# Patient Record
Sex: Male | Born: 1960
Health system: Southern US, Community
[De-identification: ages and names within clinical notes are randomized; demographics above are authoritative.]

## PROBLEM LIST (undated history)

## (undated) DIAGNOSIS — Z9289 Personal history of other medical treatment: Secondary | ICD-10-CM

## (undated) DIAGNOSIS — K219 Gastro-esophageal reflux disease without esophagitis: Secondary | ICD-10-CM

## (undated) DIAGNOSIS — T466X5A Adverse effect of antihyperlipidemic and antiarteriosclerotic drugs, initial encounter: Secondary | ICD-10-CM

## (undated) DIAGNOSIS — R011 Cardiac murmur, unspecified: Secondary | ICD-10-CM

## (undated) DIAGNOSIS — I2510 Atherosclerotic heart disease of native coronary artery without angina pectoris: Secondary | ICD-10-CM

## (undated) DIAGNOSIS — I219 Acute myocardial infarction, unspecified: Secondary | ICD-10-CM

## (undated) DIAGNOSIS — I10 Essential (primary) hypertension: Secondary | ICD-10-CM

## (undated) DIAGNOSIS — R9439 Abnormal result of other cardiovascular function study: Secondary | ICD-10-CM

## (undated) DIAGNOSIS — F419 Anxiety disorder, unspecified: Secondary | ICD-10-CM

## (undated) DIAGNOSIS — I519 Heart disease, unspecified: Secondary | ICD-10-CM

## (undated) DIAGNOSIS — R931 Abnormal findings on diagnostic imaging of heart and coronary circulation: Secondary | ICD-10-CM

## (undated) DIAGNOSIS — F1910 Other psychoactive substance abuse, uncomplicated: Secondary | ICD-10-CM

## (undated) DIAGNOSIS — E785 Hyperlipidemia, unspecified: Secondary | ICD-10-CM

## (undated) DIAGNOSIS — M6282 Rhabdomyolysis: Secondary | ICD-10-CM

## (undated) DIAGNOSIS — I739 Peripheral vascular disease, unspecified: Secondary | ICD-10-CM

## (undated) HISTORY — DX: Abnormal result of other cardiovascular function study: R94.39

## (undated) HISTORY — DX: Gastro-esophageal reflux disease without esophagitis: K21.9

## (undated) HISTORY — DX: Other psychoactive substance abuse, uncomplicated: F19.10

## (undated) HISTORY — DX: Hyperlipidemia, unspecified: E78.5

## (undated) HISTORY — DX: Personal history of other medical treatment: Z92.89

## (undated) HISTORY — DX: Abnormal findings on diagnostic imaging of heart and coronary circulation: R93.1

## (undated) HISTORY — DX: Heart disease, unspecified: I51.9

## (undated) HISTORY — DX: Anxiety disorder, unspecified: F41.9

## (undated) HISTORY — DX: Cardiac murmur, unspecified: R01.1

## (undated) HISTORY — DX: Peripheral vascular disease, unspecified: I73.9

## (undated) HISTORY — DX: Essential (primary) hypertension: I10

---

## 2009-09-18 DIAGNOSIS — I219 Acute myocardial infarction, unspecified: Secondary | ICD-10-CM

## 2009-09-18 HISTORY — DX: Acute myocardial infarction, unspecified: I21.9

## 2009-10-06 ENCOUNTER — Inpatient Hospital Stay (HOSPITAL_COMMUNITY): Admission: EM | Admit: 2009-10-06 | Discharge: 2009-10-10 | Payer: Self-pay | Admitting: Emergency Medicine

## 2009-10-06 HISTORY — PX: CORONARY STENT PLACEMENT: SHX1402

## 2009-11-18 DIAGNOSIS — R931 Abnormal findings on diagnostic imaging of heart and coronary circulation: Secondary | ICD-10-CM

## 2009-11-18 DIAGNOSIS — R9439 Abnormal result of other cardiovascular function study: Secondary | ICD-10-CM

## 2009-11-18 HISTORY — DX: Abnormal result of other cardiovascular function study: R94.39

## 2009-11-18 HISTORY — DX: Abnormal findings on diagnostic imaging of heart and coronary circulation: R93.1

## 2010-01-25 ENCOUNTER — Emergency Department (HOSPITAL_COMMUNITY): Admission: EM | Admit: 2010-01-25 | Discharge: 2010-01-26 | Payer: Self-pay | Admitting: Emergency Medicine

## 2010-07-16 ENCOUNTER — Emergency Department (HOSPITAL_COMMUNITY): Admission: EM | Admit: 2010-07-16 | Discharge: 2010-07-16 | Payer: Self-pay | Admitting: Emergency Medicine

## 2010-08-30 ENCOUNTER — Emergency Department (HOSPITAL_COMMUNITY): Admission: EM | Admit: 2010-08-30 | Discharge: 2010-08-30 | Payer: Self-pay | Admitting: Emergency Medicine

## 2010-10-25 ENCOUNTER — Emergency Department (HOSPITAL_COMMUNITY): Admission: EM | Admit: 2010-10-25 | Discharge: 2010-07-20 | Payer: Self-pay | Admitting: Emergency Medicine

## 2010-12-07 ENCOUNTER — Emergency Department (HOSPITAL_COMMUNITY)
Admission: EM | Admit: 2010-12-07 | Discharge: 2010-12-08 | Payer: Self-pay | Source: Home / Self Care | Admitting: Emergency Medicine

## 2010-12-10 ENCOUNTER — Observation Stay (HOSPITAL_COMMUNITY): Admission: EM | Admit: 2010-12-10 | Discharge: 2010-12-11 | Payer: Self-pay | Source: Home / Self Care

## 2010-12-11 LAB — DIFFERENTIAL
Basophils Absolute: 0 10*3/uL (ref 0.0–0.1)
Eosinophils Absolute: 0 10*3/uL (ref 0.0–0.7)
Lymphocytes Relative: 29 % (ref 12–46)
Lymphs Abs: 2.1 10*3/uL (ref 0.7–4.0)
Monocytes Relative: 8 % (ref 3–12)
Monocytes Relative: 10 % (ref 3–12)
Neutrophils Relative %: 66 % (ref 43–77)

## 2010-12-11 LAB — CBC
HCT: 38.7 % — ABNORMAL LOW (ref 39.0–52.0)
Hemoglobin: 14.2 g/dL (ref 13.0–17.0)
MCH: 31.6 pg (ref 26.0–34.0)
MCHC: 36.8 g/dL — ABNORMAL HIGH (ref 30.0–36.0)
MCV: 85.4 fL (ref 78.0–100.0)
MCV: 86 fL (ref 78.0–100.0)
RBC: 4.65 MIL/uL (ref 4.22–5.81)
RBC: 4.5 MIL/uL (ref 4.22–5.81)
WBC: 8.2 10*3/uL (ref 4.0–10.5)

## 2010-12-11 LAB — BASIC METABOLIC PANEL
BUN: 7 mg/dL (ref 6–23)
Calcium: 9.3 mg/dL (ref 8.4–10.5)
GFR calc Af Amer: >60 mL/min (ref >60)
GFR calc non Af Amer: >60 mL/min (ref >60)
Glucose, Bld: 91 mg/dL (ref 70–99)
Potassium: 3.9 mEq/L (ref 3.5–5.1)
Sodium: 135 mEq/L (ref 135–145)

## 2010-12-11 LAB — POCT CARDIAC MARKERS
Myoglobin, poc: 50.1 ng/mL (ref 12–200)
Troponin i, poc: <0.05 ng/mL (ref 0.00–0.09)

## 2010-12-12 LAB — BASIC METABOLIC PANEL
BUN: 5 mg/dL — ABNORMAL LOW (ref 6–23)
CO2: 27 mEq/L (ref 19–32)
Calcium: 8.8 mg/dL (ref 8.4–10.5)
Chloride: 105 mEq/L (ref 96–112)
Creatinine, Ser: 0.78 mg/dL (ref 0.4–1.5)
Glucose, Bld: 135 mg/dL — ABNORMAL HIGH (ref 70–99)

## 2010-12-12 LAB — DIFFERENTIAL
Basophils Relative: 0 % (ref 0–1)
Eosinophils Absolute: 0.1 10*3/uL (ref 0.0–0.7)
Eosinophils Relative: 1 % (ref 0–5)
Lymphs Abs: 2.4 10*3/uL (ref 0.7–4.0)
Monocytes Absolute: 0.6 10*3/uL (ref 0.1–1.0)
Monocytes Relative: 9 % (ref 3–12)

## 2010-12-12 LAB — COMPREHENSIVE METABOLIC PANEL
ALT: 16 U/L (ref 0–53)
AST: 19 U/L (ref 0–37)
Alkaline Phosphatase: 43 U/L (ref 39–117)
CO2: 28 mEq/L (ref 19–32)
Calcium: 9.1 mg/dL (ref 8.4–10.5)
GFR calc Af Amer: >60 mL/min (ref >60)
Potassium: 3.9 mEq/L (ref 3.5–5.1)

## 2010-12-12 LAB — CARDIAC PANEL(CRET KIN+CKTOT+MB+TROPI)
CK, MB: 1.6 ng/mL (ref 0.3–4.0)
Total CK: 50 U/L (ref 7–232)
Troponin I: <0.01 ng/mL (ref 0.00–0.06)

## 2010-12-12 LAB — CBC
Hemoglobin: 13.4 g/dL (ref 13.0–17.0)
MCH: 31.2 pg (ref 26.0–34.0)
MCHC: 36.2 g/dL — ABNORMAL HIGH (ref 30.0–36.0)
MCV: 86 fL (ref 78.0–100.0)
Platelets: 231 10*3/uL (ref 150–400)
RBC: 4.3 MIL/uL (ref 4.22–5.81)
RDW: 12.3 % (ref 11.5–15.5)

## 2010-12-12 LAB — POCT CARDIAC MARKERS: CKMB, poc: <1 ng/mL — ABNORMAL LOW (ref 1.0–8.0)

## 2010-12-13 ENCOUNTER — Inpatient Hospital Stay (HOSPITAL_COMMUNITY)
Admission: EM | Admit: 2010-12-13 | Discharge: 2010-12-14 | Payer: Self-pay | Source: Home / Self Care | Attending: Cardiovascular Disease | Admitting: Cardiovascular Disease

## 2010-12-13 LAB — DIFFERENTIAL
Basophils Absolute: 0 10*3/uL (ref 0.0–0.1)
Basophils Relative: 0 % (ref 0–1)
Eosinophils Absolute: 0 10*3/uL (ref 0.0–0.7)
Eosinophils Relative: 0 % (ref 0–5)
Lymphocytes Relative: 20 % (ref 12–46)
Monocytes Absolute: 0.5 10*3/uL (ref 0.1–1.0)
Monocytes Relative: 7 % (ref 3–12)
Neutro Abs: 5.5 10*3/uL (ref 1.7–7.7)
Neutrophils Relative %: 73 % (ref 43–77)

## 2010-12-13 LAB — POCT CARDIAC MARKERS: Troponin i, poc: <0.05 ng/mL (ref 0.00–0.09)

## 2010-12-13 LAB — BASIC METABOLIC PANEL
CO2: 26 mEq/L (ref 19–32)
Creatinine, Ser: 0.76 mg/dL (ref 0.4–1.5)
GFR calc Af Amer: >60 mL/min (ref >60)
GFR calc non Af Amer: >60 mL/min (ref >60)
Potassium: 4.5 mEq/L (ref 3.5–5.1)

## 2010-12-13 LAB — CBC
MCH: 30.2 pg (ref 26.0–34.0)
MCV: 86.5 fL (ref 78.0–100.0)
WBC: 7.5 10*3/uL (ref 4.0–10.5)

## 2010-12-13 LAB — PROTIME-INR: INR: 1.01 (ref 0.00–1.49)

## 2010-12-14 DIAGNOSIS — Z9289 Personal history of other medical treatment: Secondary | ICD-10-CM

## 2010-12-14 HISTORY — DX: Personal history of other medical treatment: Z92.89

## 2010-12-14 HISTORY — PX: CARDIAC CATHETERIZATION: SHX172

## 2010-12-14 LAB — CARDIAC PANEL(CRET KIN+CKTOT+MB+TROPI): Relative Index: INVALID (ref 0.0–2.5)

## 2010-12-14 LAB — COMPREHENSIVE METABOLIC PANEL
ALT: 16 U/L (ref 0–53)
AST: 20 U/L (ref 0–37)
Albumin: 4.5 g/dL (ref 3.5–5.2)
BUN: 7 mg/dL (ref 6–23)
Calcium: 9.5 mg/dL (ref 8.4–10.5)
GFR calc Af Amer: >60 mL/min (ref >60)
Glucose, Bld: 88 mg/dL (ref 70–99)
Sodium: 136 mEq/L (ref 135–145)
Total Bilirubin: 1.2 mg/dL (ref 0.3–1.2)
Total Protein: 6.7 g/dL (ref 6.0–8.3)

## 2010-12-14 LAB — CBC
HCT: 40.7 % (ref 39.0–52.0)
MCHC: 34.4 g/dL (ref 30.0–36.0)
MCV: 86.8 fL (ref 78.0–100.0)
Platelets: 243 10*3/uL (ref 150–400)
RDW: 12.2 % (ref 11.5–15.5)
WBC: 6.9 10*3/uL (ref 4.0–10.5)

## 2010-12-14 LAB — CK TOTAL AND CKMB (NOT AT ARMC)
CK, MB: 1.9 ng/mL (ref 0.3–4.0)
Total CK: 60 U/L (ref 7–232)

## 2010-12-14 LAB — LIPID PANEL
Cholesterol: 126 mg/dL (ref 0–200)
LDL Cholesterol: 57 mg/dL (ref 0–99)
VLDL: 5 mg/dL (ref 0–40)

## 2010-12-14 LAB — BASIC METABOLIC PANEL
BUN: 5 mg/dL — ABNORMAL LOW (ref 6–23)
CO2: 25 mEq/L (ref 19–32)
Calcium: 9 mg/dL (ref 8.4–10.5)
Chloride: 105 mEq/L (ref 96–112)
Creatinine, Ser: 0.7 mg/dL (ref 0.4–1.5)
GFR calc Af Amer: >60 mL/min (ref >60)
Glucose, Bld: 84 mg/dL (ref 70–99)

## 2010-12-14 LAB — MAGNESIUM: Magnesium: 1.7 mg/dL (ref 1.5–2.5)

## 2010-12-14 LAB — POCT CARDIAC MARKERS: Myoglobin, poc: 27 ng/mL (ref 12–200)

## 2010-12-18 NOTE — Discharge Summary (Signed)
Jonathan [name], Jonathan [name]          ACCOUNT NO.:  [id number]  MEDICAL RECORD NO.:  [id number]          PATIENT TYPE:  OBV  LOCATION:  IC11                          FACILITY:  [facility]  PHYSICIAN:  Wilson Singer, M.D.DATE OF BIRTH:  [birth year]  DATE OF ADMISSION:  12/10/2010 DATE OF DISCHARGE:  01/24/2012LH                              DISCHARGE SUMMARY   FINAL DISCHARGE DIAGNOSES: 1. Noncardiac chest and abdominal tightness. 2. Coronary artery disease status post inferior myocardial infarction,     November 2010, status post percutaneous intervention of the branch     of the circumflex marginal vessel with normal ejection fraction on     a stress test with no evidence of ischemia in September 2011. 3. Gastroesophageal reflux disease.  CONDITION ON DISCHARGE:  Stable.  MEDICATIONS ON DISCHARGE:  He is to continue his home medications which include; 1. Bystolic 5 mg daily. 2. Lisinopril 10 mg daily. 3. Plavix 75 mg daily. 4. Protonix 40 mg daily. 5. Aspirin 81 mg daily. 6. Simvastatin 40 mg nightly. 7. Niaspan 500 mg nightly. 8. Nitroglycerin as needed. 9. Multivitamin daily. 10.Zolpidem 2 mg daily. 11.Vitamin C 1000 mg b.i.d. 12.Ginger root 550 mg daily. 13.Fish oil nightly. 14.Isosorbide mononitrate 30 mg daily. 15.Super B-complex daily. 16.Vitamin E 400 international units nightly.  HISTORY:  This very pleasant 50 year old man was admitted to the hospital with a 2-week history of abdominal tightness radiating up to his chest and almost always relieved by opening his bowels.  The chest tightness was not really related to any exertion and it is very different from the pain and tightness he felt with his myocardial infarction in November 2010.  Please see initial history and physical examination by Dr. Osvaldo Shipper.  HOSPITAL PROGRESS:  The patient was admitted overnight and had serial cardiac enzymes, which are essentially negative for ischemia and  also electrocardiograms are unchanged and normal sinus rhythm with no evidence of ST-T-wave changes are new.  I feel that he is clinically stable to be discharged now and I have spoken with Dr. Julieanne Manson, Cardiology with [facility], who is with the group that is his cardiologist and he agrees that he can be discharged and they will schedule him for an outpatient appointment in the next week or so.  DISPOSITION:  Discharged home with outpatient followup with Cardiology, [facility].     Wilson Singer, M.D.     NCG/MEDQ  D:  12/11/2010  T:  12/12/2010  Job:  [id number]  Electronically Signed by Lilly Cove M.D. on 12/18/2010 02:09:23 PM

## 2010-12-18 NOTE — Procedures (Signed)
NAME:  Jonathan [name], Jonathan [name]          ACCOUNT NO.:  [id number]  MEDICAL RECORD NO.:  [id number]           PATIENT TYPE:  LOCATION:                                 FACILITY:  PHYSICIAN:  Thurmon Fair, MD     DATE OF BIRTH:  [birth year]  DATE OF PROCEDURE: DATE OF DISCHARGE:                           CARDIAC CATHETERIZATION   PREOPERATIVE DIAGNOSES: 1. Recurrent chest pain at rest. 2. Coronary disease status post previous inferolateral wall myocardial     infarction status post percutaneous revascularization and placement     of bare-metal stent in March 2011.  POSTOPERATIVE DIAGNOSES: 1. Minor coronary atherosclerotic disease with widely patent stent     with minimal in-stent restenosis. 2. Noncardiac chest pain, favor peptic ulcer disease as possible     etiology.  Jonathan [name] is a 50 year old man with a history of an acute myocardial infarction due to total occlusion of a large oblique marginal branch of the left circumflex coronary artery in March 2011.  This is a second admission this month with recurrent chest pain at rest raising concerns for possible recurrent coronary insufficiency.  There are no EKG changes or acute enzyme release.  PROCEDURES PERFORMED: 1. Left heart catheterization. 2. Selective coronary angiography. 3. Left ventriculography.  After the risks and benefits of the procedure were described, the patient provided informed consent and was brought to the cardiac cath lab in the fasting state.  The right radial artery area was prepped and draped in usual sterile fashion.  An Freida Busman test had previously been performed showing excellent patency of both the radial and ulnar supply to his palmar arch.  Using a micropuncture kit, access to the right radial artery was obtained and the guidewire was subsequently exchanged for a 5-French Glidesheath.  Under fluoroscopic guidance and using an exchange-length guidewire, a TIG4 5-French catheter was advanced  to the aortic root and was used to selectively cannulate and perform angiograms of both the left and right coronary arteries.  This was subsequently exchanged over the wire for a pigtail catheter which was used across the aortic valve and perform a left ventriculogram in the RAO projection.  At the end of the procedure, all catheters and sheath were removed and hemostasis was achieved with local pressure using a TR band with 13 mL of air.  During the procedure, a vasodilator cocktail was administered via the radial sheath consisting of verapamil 5 mg, nitroglycerin 400 mcg, and lidocaine 1% 2 mL.  No complications occurred.  FINDINGS: 1. Left main coronary artery is a very short vessel, free of any     significant atherosclerosis. 2. The LAD artery generates two proximal diagonal vessels of which the     second is large and bifurcates.  The LAD itself continues as a     large lumen and long vessel well beyond the left ventricular apex.     There is mild plaque in the mid to distal LAD without any     significant obstruction. 3. The left circumflex coronary artery is a large but nondominant     vessel.  The major oblique marginal artery has a previously placed  stent just before its bifurcation.  There is no more than 30%     proximal in-stent restenosis that is not encumber flow.  There is     also a medium-sized third oblique marginal artery. 4. The right coronary artery is a dominant medium-sized vessel that     does not have any visible atherosclerotic disease. 5. The left ventricle is normal in size, regional wall motion overall     systolic function with estimated ejection fraction of 55%, the left     ventricle end-diastolic pressure is 8 mmHg.  There is no evidence     of mitral regurgitation or aortic stenosis by pullback to the     aorta.  CONCLUSION:  Jonathan [name] has a previously placed stent in the oblique marginal artery that exhibits mild in-stent restenosis  that is highly unlikely to explain his current complaints.  No other coronary lesions are seen.  Outpatient gastrointestinal workup is recommended.     Thurmon Fair, MD     MC/MEDQ  D:  12/14/2010  T:  12/15/2010  Job:  [id number]  cc:   [facility].  Electronically Signed by Thurmon Fair M.D. on 12/18/2010 08:25:52 AM

## 2010-12-19 NOTE — Discharge Summary (Signed)
NAMEMarland Kitchen  Jonathan [name], Jonathan [name]          ACCOUNT NO.:  [id number]  MEDICAL RECORD NO.:  [id number]          PATIENT TYPE:  INP  LOCATION:  6522                         FACILITY:  [facility]  PHYSICIAN:  Nicki Guadalajara, M.D.     DATE OF BIRTH:  1961/11/03  DATE OF ADMISSION:  12/14/2010 DATE OF DISCHARGE:  12/14/2010                              DISCHARGE SUMMARY   DISCHARGE DIAGNOSES: 1. Chest pain, noncardiac, most likely reflux or gastrointestinal     oriented.     a.     Negative myocardial infarction.     b.     Patent coronary arteries with patent stents. 2. Known coronary artery disease with a history of ST-elevation     myocardial infarction in 2010 with a stent to the second obtuse     marginal artery. 3. Irritable bowel-type symptoms with intermittent diarrhea and     constipation. 4. Gastroesophageal reflux disease. 5. Dyslipidemia.  DISCHARGE CONDITION:  Stable.  DISCHARGE MEDICATIONS:  See medication reconciliation sheet, but we will give him a holiday from his fish oil and Niaspan as this may be aggravating GI symptoms and we will also discontinue the Pepcid, but we will continue the Protonix for now.  DISCHARGE INSTRUCTIONS: 1. He could return to work on December 18, 2010.  Increase activity     slowly.  May shower on December 15, 2010.  No lifting for 1 week.     No driving for 2 days. 2. Heart-healthy diet. 3. Follow right radial cath instructions. 4. Follow up with Dr. Tresa Endo on December 24, 2010, at 9:45 a.m. 5. Our office will arrange for him to see a gastroenterologist and     call him with date and time on Monday.  HISTORY OF PRESENT ILLNESS:  This is a 50 year old white male with second admission to [facility] in this month of January 2012 secondary to the chest pain. His chest pain does not occur with exertion, but only after meals.  He has been having chest discomfort for between 2-3 weeks and 6 weeks.  He also complains of intermittent episodes of diarrhea  and constipation, now yesterday he had normal stool.  He does also have nausea and abdominal upset followed by right chest pain and left lower rib pain, at times burning sensation substernally.  On the day of admission, his symptoms lasted several hours and then it slowly eased. Usually, the pain improves after having a bowel movement.  Previously, he had similar symptoms in August and September.  Echo and Myoview were negative.  He was seen at [facility] a week ago with similar symptoms and was found to have large colonic stool and then used MiraLax once daily.  He was admitted.  Cardiac enzymes have been negative and because of continued episodes of pain he underwent cardiac catheterization and he was certainly in window for restenosis.  His stent has 30% in-stent restenosis, but otherwise his cath was clean, EF was 55% and this was the right radial approach cardiac cath.  He is stable.  On his abdominal films, he still had moderate to large stool burden throughout the colon similar to prior study.  He needs to see gastroenterologist.  He will be followed up as an outpatient.     Darcella Gasman. Annie Paras, N.P.   ______________________________ Nicki Guadalajara, M.D.    LRI/MEDQ  D:  12/14/2010  T:  12/15/2010  Job:  [id number]  Electronically Signed by Nada Boozer N.P. on 12/17/2010 03:20:35 PM Electronically Signed by Nicki Guadalajara M.D. on 12/19/2010 02:39:50 PM

## 2010-12-20 ENCOUNTER — Emergency Department (HOSPITAL_COMMUNITY)
Admission: EM | Admit: 2010-12-20 | Discharge: 2010-12-20 | Disposition: A | Discharge status: Home / Self Care | Payer: 59 | Attending: Emergency Medicine | Admitting: Emergency Medicine

## 2010-12-20 DIAGNOSIS — I252 Old myocardial infarction: Secondary | ICD-10-CM | POA: Insufficient documentation

## 2010-12-20 DIAGNOSIS — S5010XA Contusion of unspecified forearm, initial encounter: Secondary | ICD-10-CM | POA: Insufficient documentation

## 2010-12-20 DIAGNOSIS — Z79899 Other long term (current) drug therapy: Secondary | ICD-10-CM | POA: Insufficient documentation

## 2010-12-20 DIAGNOSIS — K219 Gastro-esophageal reflux disease without esophagitis: Secondary | ICD-10-CM | POA: Insufficient documentation

## 2010-12-20 DIAGNOSIS — I10 Essential (primary) hypertension: Secondary | ICD-10-CM | POA: Insufficient documentation

## 2010-12-20 DIAGNOSIS — M79609 Pain in unspecified limb: Secondary | ICD-10-CM | POA: Insufficient documentation

## 2010-12-20 DIAGNOSIS — Y929 Unspecified place or not applicable: Secondary | ICD-10-CM | POA: Insufficient documentation

## 2010-12-20 DIAGNOSIS — Y840 Cardiac catheterization as the cause of abnormal reaction of the patient, or of later complication, without mention of misadventure at the time of the procedure: Secondary | ICD-10-CM | POA: Insufficient documentation

## 2010-12-23 NOTE — H&P (Signed)
NAME:  Jonathan [name], Jonathan [name]          ACCOUNT NO.:  [id number]  MEDICAL RECORD NO.:  [id number]          PATIENT TYPE:  EMS  LOCATION:  ED                            FACILITY:  [facility]  PHYSICIAN:  Osvaldo Shipper, MD     DATE OF BIRTH:  1961/11/17  DATE OF ADMISSION:  12/11/2010 DATE OF DISCHARGE:  LH                             HISTORY & PHYSICAL   PRIMARY CARE PHYSICIAN:  The patient does not have a primary care physician.  He is followed by Dr. Tresa Endo with [facility].  ADMISSION DIAGNOSES: 1. Chest pain. 2. History of coronary artery disease, status post myocardial     infarction. 3. History of gastroesophageal reflux disease.  CHIEF COMPLAINT:  Chest pain on and off for 2 weeks.  HISTORY OF PRESENT ILLNESS:  The patient is a 50 year old Caucasian male, who has a history of MI, which he had in November 2010.  It was an ST elevation MI with occlusion found in the second obtuse marginal, which was stented with a nondrug eluting stent.  The patient has done well since then.  He had similar chest pain back in September for which he was seen by his cardiologist and underwent a stress test and an echocardiogram, which have been reported as being on normal.  The patient tells me that over the past 2-3 weeks, he has had some left- sided chest pressure.  He has at about four episodes of the same and they have been related in some fashion to diarrhea, which he has had as well in the last few weeks.  Whenever he has a bowel movement, the chest pain tends to go away.  The pain is described as a pressure-like sensation.  It was 2/10 in intensity, currently it is 1/10 in intensity. He does admit to some heartburn, but is not sure if that is related to the pain.  He is very sure that this pain is not similar to the pain he had when he had his heart attack.  On Saturday, he had nausea, which persisted on and off.  He has not had any diarrhea since Friday, however, still  getting some left-sided chest pressure, especially with activity.  With moving things and with lifting heavy weights, he still gets similar symptoms.  He denies any fever, cough or shortness of breath.  The pain is also located occasionally on the right side.  There is occasionally some back pain as well.  He denies any diaphoresis per say.  So basically a very unusual history with some atypical features, but also some features concerning for CAD.  MEDICATIONS AT HOME: 1. Bystolic 5 mg once a day. 2. Lisinopril 10 mg once a day. 3. Plavix 75 mg once a day. 4. Protonix 40 mg daily. 5. Aspirin 81 mg daily. 6. Simvastatin 40 mg at bedtime. 7. Niaspan 500 mg at bedtime. 8. Nitroglycerin as needed. 9. Multivitamin daily. 10.Zolpidem 2 daily. 11.Vitamin C 1000 mg twice daily. 12.Ginger root 550 mg daily. 13.Fish oil nightly. 14.Isosorbide mononitrate 30 mg once a day. 15.Super B complex daily. 16.Vitamin E 400 international units nightly.  ALLERGIES:  NO KNOWN DRUG ALLERGIES.  PAST MEDICAL HISTORY: 1. Positive for acid reflux. 2. Hypertension. 3. Myocardial infarction as mentioned above in 2010, status post     cardiac cath.  He had a known drug eluting stent placed at that     time.  SOCIAL HISTORY:  He lives in [place] with his wife.  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  He lives in [place], [place] as I mentioned.  He denies smoking.  He drinks 3-4 times a week.  He drinks wine and occasionally rum.  No other illicit drug use.  FAMILY HISTORY:  Positive for hypertension, heart disease and kidney disease.  REVIEW OF SYSTEMS:  GENERAL:  Positive for weakness and malaise.  HEENT: Unremarkable.  CARDIOVASCULAR:  As in HPI.  Respiratory:  Unremarkable. GI:  Unremarkable, except as in HPI.  GU:  Unremarkable.  NEUROLOGIC: Unremarkable.  PSYCHIATRIC:  Unremarkable.  DERMATOLOGICAL: Unremarkable.  Other systems  reviewed and found to be negative.  PHYSICAL EXAMINATION:  VITAL SIGNS:  Temperature 98.3, blood pressure 128/69, heart rate 60, respiratory rate 16, saturation 99% on 2 liters by nasal cannula. GENERAL:  This is a thin white male in no distress. HEENT:  Head is normocephalic, atraumatic.  Pupils are equal reacting. No pallor, no icterus.  Oral mucous membranes moist.  No oral lesions are noted. NECK:  Soft and supple.  No thyromegaly appreciated.  No cervical, supraclavicular or inguinal lymphadenopathy present. LUNGS:  Clear to auscultation bilaterally with no wheezing, rales or rhonchi. CARDIOVASCULAR:  S1-S2 is normal, regular.  No S3-S4, rubs, murmurs or bruit. ABDOMEN:  Soft, nontender, nondistended.  Bowel sounds are present.  No masses or organomegaly appreciated. GU:  Deferred. MUSCULOSKELETAL:  Normal muscle mass and tone. NEUROLOGIC:  He is alert and oriented x3.  No focal neurological deficits are present. SKIN:  Does not reveal any rashes.  LABORATORY DATA:  CBC is unremarkable as is CMET.  Cardiac enzymes are negative x2.  Chest x-ray showed no active disease.  The mediastinum was normal.  EKG shows sinus rhythm at 62 with normal axis, intervals appear to be in the normal range.  No concerning ST or T-wave changes are noted.  No Q-waves are present.  Essentially benign EKG.  ASSESSMENT:  This is a 50 year old Caucasian male with a history of coronary artery disease, status post MI, who presents with on and off chest pain on the left side for the past couple of weeks.  This is obviously concerning for angina, which could be unstable, although there are no EKG changes.  Some of the characteristics of the pain appears to be GI in origin, however, others are more concerning for exertional angina kind of pain.  Since this is his second visit to the ED and his pain persists, he warrants admission with inpatient cardiology evaluation at this time.  PLAN: 1. Chest pain.   We will admit him to rule him out for acute coronary     syndrome.  EKG will be repeated.  Lipid panel will be checked.  We     will go ahead and consult Cardiology.  The patient reports a     negative stress test in September.  I will defer to Cardiology to     determine what kind of further testing they may want to do.  We     will continue with PPI for now. 2. History of hypertension.  Continue with antihypertensive regimen. 3. DVT prophylaxis will be  initiated.  Further management decisions will depend on results of further testing and patient's response to treatment.   Osvaldo Shipper, MD     GK/MEDQ  D:  12/11/2010  T:  12/11/2010  Job:  [id number]  cc:   Nicki Guadalajara, M.D. Fax: [phone]  Electronically Signed by Osvaldo Shipper MD on 12/23/2010 04:58:53 PM

## 2011-01-31 LAB — BASIC METABOLIC PANEL
BUN: 13 mg/dL (ref 6–23)
CO2: 27 mEq/L (ref 19–32)
Calcium: 9.4 mg/dL (ref 8.4–10.5)
Chloride: 103 mEq/L (ref 96–112)
Creatinine, Ser: 0.98 mg/dL (ref 0.4–1.5)
GFR calc Af Amer: >60 mL/min (ref >60)
GFR calc non Af Amer: >60 mL/min (ref >60)
Glucose, Bld: 132 mg/dL — ABNORMAL HIGH (ref 70–99)
Potassium: 4.4 mEq/L (ref 3.5–5.1)
Sodium: 135 mEq/L (ref 135–145)

## 2011-01-31 LAB — CBC
HCT: 41.7 % (ref 39.0–52.0)
Hemoglobin: 14.1 g/dL (ref 13.0–17.0)
MCH: 30.9 pg (ref 26.0–34.0)
MCHC: 33.8 g/dL (ref 30.0–36.0)
MCV: 91.4 fL (ref 78.0–100.0)
Platelets: 228 10*3/uL (ref 150–400)
RBC: 4.57 MIL/uL (ref 4.22–5.81)
RDW: 13 % (ref 11.5–15.5)
WBC: 8.2 10*3/uL (ref 4.0–10.5)

## 2011-01-31 LAB — DIFFERENTIAL
Basophils Relative: 0 % (ref 0–1)
Eosinophils Absolute: 0 10*3/uL (ref 0.0–0.7)
Lymphs Abs: 1.5 10*3/uL (ref 0.7–4.0)
Monocytes Absolute: 0.6 10*3/uL (ref 0.1–1.0)
Monocytes Relative: 7 % (ref 3–12)
Neutrophils Relative %: 74 % (ref 43–77)

## 2011-01-31 LAB — POCT CARDIAC MARKERS: Myoglobin, poc: 47.1 ng/mL (ref 12–200)

## 2011-02-01 LAB — DIFFERENTIAL
Basophils Absolute: 0 K/uL (ref 0.0–0.1)
Basophils Relative: 0 % (ref 0–1)
Eosinophils Absolute: 0 K/uL (ref 0.0–0.7)
Eosinophils Relative: 0 % (ref 0–5)
Lymphocytes Relative: 13 % (ref 12–46)
Lymphs Abs: 1.1 10*3/uL (ref 0.7–4.0)
Monocytes Absolute: 0.6 10*3/uL (ref 0.1–1.0)
Monocytes Relative: 7 % (ref 3–12)
Neutro Abs: 6.8 10*3/uL (ref 1.7–7.7)
Neutrophils Relative %: 80 % — ABNORMAL HIGH (ref 43–77)

## 2011-02-01 LAB — BASIC METABOLIC PANEL
CO2: 26 mEq/L (ref 19–32)
Calcium: 9.1 mg/dL (ref 8.4–10.5)
GFR calc Af Amer: >60 mL/min (ref >60)
GFR calc non Af Amer: >60 mL/min (ref >60)
Glucose, Bld: 113 mg/dL — ABNORMAL HIGH (ref 70–99)
Potassium: 4.7 mEq/L (ref 3.5–5.1)
Sodium: 138 mEq/L (ref 135–145)

## 2011-02-01 LAB — POCT CARDIAC MARKERS
CKMB, poc: 1.2 ng/mL (ref 1.0–8.0)
CKMB, poc: 1.2 ng/mL (ref 1.0–8.0)
CKMB, poc: 1.5 ng/mL (ref 1.0–8.0)
Myoglobin, poc: 47.1 ng/mL (ref 12–200)
Myoglobin, poc: 54.1 ng/mL (ref 12–200)
Myoglobin, poc: 58.6 ng/mL (ref 12–200)
Troponin i, poc: <0.05 ng/mL (ref 0.00–0.09)
Troponin i, poc: <0.05 ng/mL (ref 0.00–0.09)
Troponin i, poc: <0.05 ng/mL (ref 0.00–0.09)

## 2011-02-01 LAB — PROTIME-INR
INR: 1.02 (ref 0.00–1.49)
Prothrombin Time: 13.6 s (ref 11.6–15.2)

## 2011-02-01 LAB — APTT: aPTT: 28 seconds (ref 24–37)

## 2011-02-01 LAB — CBC
HCT: 40.7 % (ref 39.0–52.0)
Hemoglobin: 14.2 g/dL (ref 13.0–17.0)
MCH: 31 pg (ref 26.0–34.0)
MCHC: 34.9 g/dL (ref 30.0–36.0)
MCV: 88.9 fL (ref 78.0–100.0)
Platelets: 225 K/uL (ref 150–400)
RBC: 4.58 MIL/uL (ref 4.22–5.81)
RDW: 12.7 % (ref 11.5–15.5)
WBC: 8.5 K/uL (ref 4.0–10.5)

## 2011-02-01 LAB — BASIC METABOLIC PANEL WITH GFR
BUN: 10 mg/dL (ref 6–23)
Chloride: 109 meq/L (ref 96–112)
Creatinine, Ser: 0.89 mg/dL (ref 0.4–1.5)

## 2011-02-20 LAB — CBC
HCT: 32.7 % — ABNORMAL LOW (ref 39.0–52.0)
HCT: 35.7 % — ABNORMAL LOW (ref 39.0–52.0)
HCT: 42.2 % (ref 39.0–52.0)
Hemoglobin: 12.3 g/dL — ABNORMAL LOW (ref 13.0–17.0)
Hemoglobin: 12.4 g/dL — ABNORMAL LOW (ref 13.0–17.0)
MCHC: 34.8 g/dL (ref 30.0–36.0)
MCV: 89.7 fL (ref 78.0–100.0)
MCV: 89.9 fL (ref 78.0–100.0)
MCV: 90.2 fL (ref 78.0–100.0)
Platelets: 170 10*3/uL (ref 150–400)
Platelets: 174 10*3/uL (ref 150–400)
RBC: 3.83 MIL/uL — ABNORMAL LOW (ref 4.22–5.81)
RBC: 3.94 MIL/uL — ABNORMAL LOW (ref 4.22–5.81)
RBC: 3.96 MIL/uL — ABNORMAL LOW (ref 4.22–5.81)
RBC: 4.7 MIL/uL (ref 4.22–5.81)
RDW: 13.4 % (ref 11.5–15.5)
WBC: 13.2 10*3/uL — ABNORMAL HIGH (ref 4.0–10.5)
WBC: 14.3 10*3/uL — ABNORMAL HIGH (ref 4.0–10.5)
WBC: 6.8 10*3/uL (ref 4.0–10.5)
WBC: 8.5 10*3/uL (ref 4.0–10.5)
WBC: 8.6 10*3/uL (ref 4.0–10.5)

## 2011-02-20 LAB — CARDIAC PANEL(CRET KIN+CKTOT+MB+TROPI)
Relative Index: 10.4 — ABNORMAL HIGH (ref 0.0–2.5)
Relative Index: 14.4 — ABNORMAL HIGH (ref 0.0–2.5)
Total CK: 1028 U/L — ABNORMAL HIGH (ref 7–232)
Total CK: 1864 U/L — ABNORMAL HIGH (ref 7–232)
Total CK: 3304 U/L — ABNORMAL HIGH (ref 7–232)
Troponin I: 0.09 ng/mL — ABNORMAL HIGH (ref 0.00–0.06)
Troponin I: 46.48 ng/mL (ref 0.00–0.06)
Troponin I: >100 ng/mL (ref 0.00–0.06)

## 2011-02-20 LAB — BASIC METABOLIC PANEL
BUN: 4 mg/dL — ABNORMAL LOW (ref 6–23)
BUN: 6 mg/dL (ref 6–23)
BUN: 8 mg/dL (ref 6–23)
BUN: 8 mg/dL (ref 6–23)
CO2: 29 mEq/L (ref 19–32)
CO2: 30 mEq/L (ref 19–32)
Calcium: 8.1 mg/dL — ABNORMAL LOW (ref 8.4–10.5)
Calcium: 8.4 mg/dL (ref 8.4–10.5)
Creatinine, Ser: 0.73 mg/dL (ref 0.4–1.5)
Creatinine, Ser: 0.82 mg/dL (ref 0.4–1.5)
Creatinine, Ser: 0.87 mg/dL (ref 0.4–1.5)
Creatinine, Ser: 0.88 mg/dL (ref 0.4–1.5)
GFR calc Af Amer: >60 mL/min (ref >60)
GFR calc Af Amer: >60 mL/min (ref >60)
GFR calc Af Amer: >60 mL/min (ref >60)
GFR calc non Af Amer: >60 mL/min (ref >60)
GFR calc non Af Amer: >60 mL/min (ref >60)
Glucose, Bld: 96 mg/dL (ref 70–99)
Potassium: 4.2 mEq/L (ref 3.5–5.1)

## 2011-02-20 LAB — DIFFERENTIAL
Basophils Absolute: 0 10*3/uL (ref 0.0–0.1)
Lymphocytes Relative: 16 % (ref 12–46)
Neutro Abs: 6.7 10*3/uL (ref 1.7–7.7)

## 2011-02-20 LAB — PROTIME-INR: Prothrombin Time: 13.6 seconds (ref 11.6–15.2)

## 2011-02-20 LAB — POCT CARDIAC MARKERS
CKMB, poc: <1 ng/mL — ABNORMAL LOW (ref 1.0–8.0)
Myoglobin, poc: 59.2 ng/mL (ref 12–200)
Troponin i, poc: <0.05 ng/mL (ref 0.00–0.09)

## 2011-02-20 LAB — LIPID PANEL
Cholesterol: 192 mg/dL (ref 0–200)
Cholesterol: 230 mg/dL — ABNORMAL HIGH (ref 0–200)
HDL: 35 mg/dL — ABNORMAL LOW (ref >39)
HDL: 39 mg/dL — ABNORMAL LOW (ref >39)
Total CHOL/HDL Ratio: 5.5 RATIO
Triglycerides: 67 mg/dL (ref <150)
VLDL: 11 mg/dL (ref 0–40)

## 2011-02-20 LAB — COMPREHENSIVE METABOLIC PANEL
BUN: 10 mg/dL (ref 6–23)
CO2: 21 mEq/L (ref 19–32)
Chloride: 105 mEq/L (ref 96–112)
Creatinine, Ser: 0.7 mg/dL (ref 0.4–1.5)
GFR calc non Af Amer: >60 mL/min (ref >60)
Glucose, Bld: 118 mg/dL — ABNORMAL HIGH (ref 70–99)
Total Bilirubin: 1.1 mg/dL (ref 0.3–1.2)

## 2011-02-20 LAB — HEPARIN LEVEL (UNFRACTIONATED)
Heparin Unfractionated: 0.25 IU/mL — ABNORMAL LOW (ref 0.30–0.70)
Heparin Unfractionated: 0.64 IU/mL (ref 0.30–0.70)
Heparin Unfractionated: 1.01 IU/mL — ABNORMAL HIGH (ref 0.30–0.70)

## 2011-02-20 LAB — CK TOTAL AND CKMB (NOT AT ARMC)
Relative Index: 4.8 — ABNORMAL HIGH (ref 0.0–2.5)
Total CK: 465 U/L — ABNORMAL HIGH (ref 7–232)

## 2011-02-20 LAB — APTT: aPTT: 61 seconds — ABNORMAL HIGH (ref 24–37)

## 2011-06-10 LAB — LIPID PANEL
HDL: 56 mg/dL (ref 35–70)
LDL Cholesterol: 51 mg/dL

## 2011-11-26 ENCOUNTER — Encounter: Payer: Self-pay | Admitting: *Deleted

## 2011-11-26 ENCOUNTER — Emergency Department (HOSPITAL_COMMUNITY)
Admission: EM | Admit: 2011-11-26 | Discharge: 2011-11-26 | Disposition: A | Discharge status: Home / Self Care | Payer: 59 | Attending: Emergency Medicine | Admitting: Emergency Medicine

## 2011-11-26 DIAGNOSIS — IMO0001 Reserved for inherently not codable concepts without codable children: Secondary | ICD-10-CM | POA: Insufficient documentation

## 2011-11-26 DIAGNOSIS — Z7982 Long term (current) use of aspirin: Secondary | ICD-10-CM | POA: Insufficient documentation

## 2011-11-26 DIAGNOSIS — Z79899 Other long term (current) drug therapy: Secondary | ICD-10-CM | POA: Insufficient documentation

## 2011-11-26 DIAGNOSIS — M79609 Pain in unspecified limb: Secondary | ICD-10-CM | POA: Insufficient documentation

## 2011-11-26 DIAGNOSIS — M2550 Pain in unspecified joint: Secondary | ICD-10-CM | POA: Insufficient documentation

## 2011-11-26 DIAGNOSIS — I2510 Atherosclerotic heart disease of native coronary artery without angina pectoris: Secondary | ICD-10-CM | POA: Insufficient documentation

## 2011-11-26 DIAGNOSIS — M79606 Pain in leg, unspecified: Secondary | ICD-10-CM

## 2011-11-26 DIAGNOSIS — I252 Old myocardial infarction: Secondary | ICD-10-CM | POA: Insufficient documentation

## 2011-11-26 HISTORY — DX: Acute myocardial infarction, unspecified: I21.9

## 2011-11-26 HISTORY — DX: Adverse effect of antihyperlipidemic and antiarteriosclerotic drugs, initial encounter: T46.6X5A

## 2011-11-26 HISTORY — DX: Rhabdomyolysis: M62.82

## 2011-11-26 HISTORY — DX: Atherosclerotic heart disease of native coronary artery without angina pectoris: I25.10

## 2011-11-26 LAB — BASIC METABOLIC PANEL
CO2: 30 mEq/L (ref 19–32)
Chloride: 99 mEq/L (ref 96–112)
Potassium: 4.1 mEq/L (ref 3.5–5.1)
Sodium: 134 mEq/L — ABNORMAL LOW (ref 135–145)

## 2011-11-26 LAB — CK: Total CK: 73 U/L (ref 7–232)

## 2011-11-26 NOTE — ED Notes (Signed)
Pt is waiting on lab results.

## 2011-11-26 NOTE — ED Notes (Signed)
Lt leg pain for over 2 weeks, feels is getting worse.    No injury.  Increased pain with movement.

## 2011-11-26 NOTE — ED Notes (Signed)
Pt dc to home with follow up instructions that were verbally understood.  Pt walks with steady gait.

## 2011-11-28 ENCOUNTER — Encounter (HOSPITAL_COMMUNITY): Payer: Self-pay | Admitting: Emergency Medicine

## 2011-11-28 NOTE — ED Provider Notes (Signed)
History     CSN: [id number]  Arrival date & time 11/26/11  1039   First MD Initiated Contact with Patient 11/26/11 1130      Chief Complaint  Patient presents with  . Leg Pain    (Consider location/radiation/quality/duration/timing/severity/associated sxs/prior treatment) Patient is a 51 y.o. male presenting with leg pain. The history is provided by the patient.  Leg Pain  Incident onset: More than 2 weeks ago. Incident location: He reports increasing daily pain in his left calf and left medial upper thigh for the past 2 weeks.  Denies injury.  States pain is worse with exertion and gets better with rest.  There was no injury mechanism. The pain is present in the left thigh and left leg. The quality of the pain is described as burning and aching. Pain scale: 5 at worst,  1 currently. The pain has been intermittent since onset. Pertinent negatives include no numbness, no inability to bear weight, no loss of motion and no muscle weakness. Associated symptoms comments: Rest and elevation alleviates the symptoms.  Works as Civil [employer] fast streamer,  Pain worsens as his shift progresses.  He does not drive long distances with his job.. The symptoms are aggravated by activity. He has tried nothing for the symptoms.    Past Medical History  Diagnosis Date  . Coronary artery disease   . Myocardial infarction   . Simvastatin-induced rhabdomyolysis     Past Surgical History  Procedure Date  . Coronary stent placement     History reviewed. No pertinent family history.  History  Substance Use Topics  . Smoking status: Former Games developer  . Smokeless tobacco: Not on file  . Alcohol Use: Yes      Review of Systems  Constitutional: Negative for fever.  HENT: Negative for congestion, sore throat and neck pain.   Eyes: Negative.   Respiratory: Negative for chest tightness and shortness of breath.   Cardiovascular: Negative for chest pain.  Gastrointestinal: Negative for nausea and abdominal pain.    Genitourinary: Negative.   Musculoskeletal: Positive for myalgias and arthralgias. Negative for back pain and joint swelling.  Skin: Negative.  Negative for rash and wound.  Neurological: Negative for dizziness, weakness, light-headedness, numbness and headaches.  Hematological: Negative.   Psychiatric/Behavioral: Negative.     Allergies  Pantoprazole  Home Medications   Current Outpatient Rx  Name Route Sig Dispense Refill  . VITAMIN C 1000 MG PO TABS Oral Take 1,000 mg by mouth 2 (two) times daily.      . ASPIRIN EC 81 MG PO TBEC Oral Take 81 mg by mouth daily.      . SUPER B COMPLEX PO Oral Take 1 tablet by mouth daily.      [name] CLOPIDOGREL BISULFATE 75 MG PO TABS Oral Take 75 mg by mouth daily.      . CO Q 10 100 MG PO CAPS Oral Take 1 capsule by mouth daily.      [name] ESOMEPRAZOLE MAGNESIUM 40 MG PO CPDR Oral Take 40 mg by mouth daily.      [name] GINGER (ZINGIBER OFFICINALIS) 550 MG PO CAPS Oral Take 1 capsule by mouth daily.      . ISOSORBIDE MONONITRATE ER 30 MG PO TB24 Oral Take 30 mg by mouth daily.      [name] LISINOPRIL 10 MG PO TABS Oral Take 10 mg by mouth daily.      [name] MAGNESIUM 250 MG PO TABS Oral Take 1 tablet by mouth daily.      [name]  ADULT MULTIVITAMIN W/MINERALS CH Oral Take 1 tablet by mouth daily.      . NEBIVOLOL HCL 5 MG PO TABS Oral Take 5 mg by mouth daily.      [name] NIACIN ER (ANTIHYPERLIPIDEMIC) 500 MG PO TBCR Oral Take 500 mg by mouth at bedtime.      [name] NITROGLYCERIN 0.4 MG SL SUBL Sublingual Place 0.4 mg under the tongue every 5 (five) minutes as needed. For chest pains     . FISH OIL 1200 MG PO CAPS Oral Take 1 capsule by mouth daily.      [name] ROSUVASTATIN CALCIUM 10 MG PO TABS Oral Take 10 mg by mouth daily.      . SAW PALMETTO BERRIES PO Oral Take 1 tablet by mouth daily.      [name] VITAMIN E 400 UNITS PO CAPS Oral Take 400 Units by mouth daily.      [name] ZINC 50 MG PO TABS Oral Take 1 tablet by mouth daily.        BP 136/84  Pulse 71  Temp(Src) 98 F (36.7 C) (Oral)   Resp 20  Ht 5\' 10"  (1.778 m)  Wt 170 lb (77.111 kg)  BMI 24.39 kg/m2  SpO2 100%  Physical Exam  Nursing note and vitals reviewed. Constitutional: He is oriented to person, place, and time. He appears well-developed and well-nourished.  HENT:  Head: Normocephalic and atraumatic.  Eyes: Conjunctivae are normal.  Neck: Normal range of motion.  Cardiovascular: Normal rate, regular rhythm, normal heart sounds and intact distal pulses.   Pulses:      Dorsalis pedis pulses are 2+ on the right side, and 2+ on the left side.       Posterior tibial pulses are 2+ on the right side, and 2+ on the left side.  Pulmonary/Chest: Effort normal and breath sounds normal. He has no wheezes.  Abdominal: Soft. Bowel sounds are normal. There is no tenderness.  Musculoskeletal: Normal range of motion.  Neurological: He is alert and oriented to person, place, and time.  Skin: Skin is warm and dry.  Psychiatric: He has a normal mood and affect.    ED Course  Procedures (including critical care time)  Labs Reviewed  BASIC METABOLIC PANEL - Abnormal; Notable for the following:    Sodium 134 (*)    Glucose, Bld 100 (*)    All other components within normal limits  CK  LAB REPORT - SCANNED   No results found.   1. Lower extremity pain       MDM  Suspect possible lower extremity claudication.  Patient with significant risk factors given cad.  Rhabdo ruled out.  Sx not consistent with dvt.  Referral to Vascular surgery for further evaluation.        Candis Musa, PA 11/28/11 [phone]

## 2011-12-03 NOTE — ED Provider Notes (Signed)
Medical screening examination/treatment/procedure(s) were performed by non-physician practitioner and as supervising physician I was immediately available for consultation/collaboration.  Nicholes Stairs, MD 12/03/11 [phone]

## 2011-12-11 ENCOUNTER — Ambulatory Visit (INDEPENDENT_AMBULATORY_CARE_PROVIDER_SITE_OTHER): Payer: 59 | Admitting: Family Medicine

## 2011-12-11 ENCOUNTER — Encounter: Payer: Self-pay | Admitting: Family Medicine

## 2011-12-11 VITALS — BP 130/70 | HR 62 | Resp 16 | Ht 70.0 in | Wt 180.0 lb

## 2011-12-11 DIAGNOSIS — E785 Hyperlipidemia, unspecified: Secondary | ICD-10-CM

## 2011-12-11 DIAGNOSIS — K219 Gastro-esophageal reflux disease without esophagitis: Secondary | ICD-10-CM

## 2011-12-11 DIAGNOSIS — I2510 Atherosclerotic heart disease of native coronary artery without angina pectoris: Secondary | ICD-10-CM | POA: Insufficient documentation

## 2011-12-11 DIAGNOSIS — Z125 Encounter for screening for malignant neoplasm of prostate: Secondary | ICD-10-CM

## 2011-12-11 DIAGNOSIS — I739 Peripheral vascular disease, unspecified: Secondary | ICD-10-CM

## 2011-12-11 NOTE — Assessment & Plan Note (Signed)
Reviewed pt last set of labs in July 2012. Will need repeat lipid panel with change to crestor and CMET in the new few months. Continue to maximize therapy for lipids. Currently on beta blocker He appears to be doing very well

## 2011-12-11 NOTE — Assessment & Plan Note (Signed)
Pt to see vascular for arterial studies. He is at risk as he has CAD

## 2011-12-11 NOTE — Assessment & Plan Note (Signed)
Continue Nexium, reviewed EGD and colonoscopy reports

## 2011-12-11 NOTE — Progress Notes (Signed)
Subjective:    Patient ID: Jonathan [name], male    DOB: 1961-02-24, 51 y.o.   MRN: [id number]  HPI Pt here to establish care. No PCP in > 20 years      CAD, s/p MI in 2010 with stent placement- Dr. Tresa Endo [facility]) - currently on plavix,  No recent chest pain ,last Cath 1 year ago, which showed minimal plaque changes around stent per report     Hyperlipidemia- Crestor and Niaspan      Left Leg pain- Dr. Edilia Bo (Vascular Surgery) appt in Feb, concern for claudiation pain mostly at rest after a lot of walking, seen in ED,, normal CK, denies leg swelling    Simvastatin caused muscle aches in the past, currently on crestor     GERD- currently on nexium- Dr. Bosie Clos (evaluated pt)- pt had EGD and Colonscopy - had polyp removed, tubular adenoma found, needs f/u every 2 years       Works as a Advertising account executive in [place]  Has Dentist   Needs PSA Checked  Review of Systems GEN- denies fatigue, fever, weight loss,weakness, recent illness HEENT- denies eye drainage, change in vision, nasal discharge, CVS- denies chest pain, palpitations RESP- denies SOB, cough, wheeze ABD- denies N/V, change in stools, abd pain GU- denies dysuria, hematuria, dribbling, incontinence MSK- denies joint pain, muscle aches, injury Neuro- denies headache, dizziness, syncope, seizure activity      Objective:   Physical Exam GEN- NAD, alert and oriented x3, pleasant HEENT- PERRL, EOMI, non injected sclera, pink conjunctiva, MMM, oropharynx clear, very poor dentition Neck- Supple, no thyromegaly, no bruit CVS- RRR, no murmur RESP-CTAB ABD- NABS, soft, NT, ND EXT- No edema Pulses- Radial, DP- 2+ Psych- Not depressed or overly anxious appearing        Assessment & Plan:

## 2011-12-11 NOTE — Assessment & Plan Note (Signed)
Pt on statin, possibility leg pain related to Crestor but pt feels he did not have recurrence of symptoms after switch from Simva. Continue with Niaspan, and Coenzyme Q

## 2011-12-11 NOTE — Patient Instructions (Signed)
Schedule a physical in 3 months Continue with your current medications Please let Vascular know to send me the note Please get your labs done 1 week before your visit with Dr. Tresa Endo

## 2011-12-13 ENCOUNTER — Encounter: Payer: Self-pay | Admitting: Family Medicine

## 2012-01-07 ENCOUNTER — Encounter: Payer: Self-pay | Admitting: Vascular Surgery

## 2012-01-08 ENCOUNTER — Ambulatory Visit (INDEPENDENT_AMBULATORY_CARE_PROVIDER_SITE_OTHER): Payer: 59 | Admitting: Vascular Surgery

## 2012-01-08 ENCOUNTER — Encounter (INDEPENDENT_AMBULATORY_CARE_PROVIDER_SITE_OTHER): Payer: 59 | Admitting: *Deleted

## 2012-01-08 ENCOUNTER — Encounter: Payer: Self-pay | Admitting: Vascular Surgery

## 2012-01-08 DIAGNOSIS — M79606 Pain in leg, unspecified: Secondary | ICD-10-CM

## 2012-01-08 DIAGNOSIS — M79609 Pain in unspecified limb: Secondary | ICD-10-CM

## 2012-01-08 NOTE — Progress Notes (Signed)
[facility] [place]  Patient name: Jonathan [name] MRN: [id number] DOB: 12-Mar-1961 Sex: male  REASON FOR CONSULT: left leg pain. Referred by Dr. Jeanice Lim.  HPI: Jonathan [name] is a 51 y.o. male who is had an approximate one year history of pain in his left lateral thigh. He does not remember any specific injury to his leg. He states that the pain comes and goes. It's localized to it a small area on his lateral left side. Is aggravated by sitting and is relieved somewhat with walking. I do not get any history of claudication or rest pain. He's had no history of nonhealing wounds. He denies any back pain. Of note, he states that he has to climb up a significant distance to get into his troponin usually uses the left leg. Lately he's been using the right leg to get up into the truck and states that his symptoms have improved since he has been resting the left leg some.  Past Medical History  Diagnosis Date  . Coronary artery disease   . Simvastatin-induced rhabdomyolysis   . Hyperlipidemia   . Substance abuse     > 20 years ago, marijauna, cocaine, hash  . Abnormal stress test 2011    SEHV-EF 59%, Moderate perfusion defect due to scar, mild periinfarct  . Echocardiogram 2011    mild posterior wall hypokinesis  . Peripheral vascular disease   . Myocardial infarction nov 2010    [name]  . Hypertension     Family History  Problem Relation Age of Onset  . Hypertension Mother   . Hyperlipidemia Mother   . Hypertension Father   . Hyperlipidemia Father   . Cancer Father     kidney  . Hypertension Brother   . Hyperlipidemia Brother   . Hypertension Sister   . Hyperlipidemia Sister     SOCIAL HISTORY: History  Substance Use Topics  . Smoking status: Former Smoker    Quit date: 01/07/2007  . Smokeless tobacco: Not on file  . Alcohol Use: Yes    Allergies  Allergen Reactions  . Pantoprazole Other (See Comments)    More stomach problems     Current Outpatient Prescriptions  Medication Sig Dispense Refill  . Ascorbic Acid (VITAMIN C) 1000 MG tablet Take 1,000 mg by mouth 2 (two) times daily.        [name] aspirin EC 81 MG tablet Take 81 mg by mouth daily.        . B Complex-C (SUPER B COMPLEX PO) Take 1 tablet by mouth daily.        . clopidogrel (PLAVIX) 75 MG tablet Take 75 mg by mouth daily.        . Coenzyme Q10 (CO Q 10) 100 MG CAPS Take 1 capsule by mouth daily.        [name] esomeprazole (NEXIUM) 40 MG capsule Take 40 mg by mouth daily.        . isosorbide mononitrate (IMDUR) 30 MG 24 hr tablet Take 30 mg by mouth daily.        [name] lisinopril (PRINIVIL,ZESTRIL) 10 MG tablet Take 10 mg by mouth daily.        . Magnesium 250 MG TABS Take 1 tablet by mouth daily.        . Multiple Vitamin (MULITIVITAMIN WITH MINERALS) TABS Take 1 tablet by mouth daily.        . nebivolol (BYSTOLIC) 5 MG tablet Take 5 mg by mouth daily.        [name]  niacin (NIASPAN) 500 MG CR tablet Take 500 mg by mouth at bedtime.        . nitroGLYCERIN (NITROSTAT) 0.4 MG SL tablet Place 0.4 mg under the tongue every 5 (five) minutes as needed. For chest pains       . Omega-3 Fatty Acids (FISH OIL) 1200 MG CAPS Take 1 capsule by mouth daily.        . rosuvastatin (CRESTOR) 10 MG tablet Take 10 mg by mouth daily.        . Saw Palmetto, Serenoa repens, (SAW PALMETTO BERRIES PO) Take 1 tablet by mouth daily.        . vitamin E 400 UNIT capsule Take 400 Units by mouth daily.        . Zinc 50 MG TABS Take 1 tablet by mouth daily.        . Ginger, Zingiber officinalis, 550 MG CAPS Take 1 capsule by mouth daily.          REVIEW OF SYSTEMS: Arly.Keller ] denotes positive finding; [  ] denotes negative finding  CARDIOVASCULAR:  [ ]  chest pain   [ ]  chest pressure   [ ]  palpitations   [ ]  orthopnea   [ ]  dyspnea on exertion   [ ]  claudication   [ ]  rest pain   [ ]  DVT   [ ]  phlebitis PULMONARY:   [ ]  productive cough   [ ]  asthma   [ ]  wheezing NEUROLOGIC:   [ ]  weakness  [ ]   paresthesias  [ ]  aphasia  [ ]  amaurosis  [ ]  dizziness HEMATOLOGIC:   [ ]  bleeding problems   [ ]  clotting disorders MUSCULOSKELETAL:  [ ]  joint pain   [ ]  joint swelling [ ]  leg swelling GASTROINTESTINAL: [ ]   blood in stool  [ ]   hematemesis GENITOURINARY:  [ ]   dysuria  [ ]   hematuria PSYCHIATRIC:  [ ]  history of major depression INTEGUMENTARY:  [ ]  rashes  [ ]  ulcers CONSTITUTIONAL:  [ ]  fever   [ ]  chills  PHYSICAL EXAM: Filed Vitals:   01/08/12 1039  BP: 128/81  Pulse: 60  Resp: 16  Height: 5\' 10"  (1.778 m)  Weight: 182 lb (82.555 kg)  SpO2: 99%   Body mass index is 26.11 kg/(m^2). GENERAL: The patient is a well-nourished male, in no acute distress. The vital signs are documented above. CARDIOVASCULAR: There is a regular rate and rhythm without significant murmur appreciated. I do not detect carotid bruits. He has palpable femoral, popliteal, and pedal pulses. He has no significant lower extremity swelling. PULMONARY: There is good air exchange bilaterally without wheezing or rales. ABDOMEN: Soft and non-tender with normal pitched bowel sounds.  MUSCULOSKELETAL: There are no major deformities or cyanosis. I cannot palpate any masses in the left thigh. NEUROLOGIC: No focal weakness or paresthesias are detected. SKIN: There are no ulcers or rashes noted. He has no significant varicose veins. PSYCHIATRIC: The patient has a normal affect.  DATA:  Lab Results  Component Value Date   WBC 6.9 12/14/2010   HGB 14.0 12/14/2010   HCT 40.7 12/14/2010   MCV 86.8 12/14/2010   PLT 243 12/14/2010   Lab Results  Component Value Date   NA 134* 11/26/2011   K 4.1 11/26/2011   CL 99 11/26/2011   CO2 30 11/26/2011   Lab Results  Component Value Date   CREATININE 0.66 11/26/2011   I have independently interpreted his arterial Doppler study in our office  today. This shows triphasic Doppler signals in both the in the anterior tibial and posterior tibial positions. He has an ABI of 100%  bilaterally.  MEDICAL ISSUES: I reassured the patient that I do not think his symptoms in his left thigh are related to peripheral vascular disease. He has palpable pedal pulses and a normal Doppler study. In addition his symptoms are relieved with ambulation. Likewise I do not think he symptoms are related to any significant venous disease. The pain sounds musculoskeletal in origin and appears to be better now that he's trying to not use the left leg so much to get into his truck. I'll be happy to see him back in the future if any new vascular issues arise.   Marien Manship S [facility] [place] Beeper: [phone]

## 2012-02-04 LAB — LIPID PANEL
Cholesterol: 158 mg/dL (ref 0–200)
HDL: 56 mg/dL (ref >39)
Total CHOL/HDL Ratio: 2.8 Ratio
Triglycerides: 65 mg/dL (ref <150)

## 2012-02-04 LAB — COMPREHENSIVE METABOLIC PANEL
Albumin: 4.6 g/dL (ref 3.5–5.2)
Alkaline Phosphatase: 69 U/L (ref 39–117)
BUN: 9 mg/dL (ref 6–23)
Creat: 0.84 mg/dL (ref 0.50–1.35)
Glucose, Bld: 85 mg/dL (ref 70–99)
Potassium: 4.8 mEq/L (ref 3.5–5.3)

## 2012-02-04 LAB — PSA: PSA: 2.24 ng/mL (ref <=4.00)

## 2012-02-04 LAB — CBC
MCV: 94.6 fL (ref 78.0–100.0)
Platelets: 260 10*3/uL (ref 150–400)
RBC: 4.82 MIL/uL (ref 4.22–5.81)
WBC: 8.1 10*3/uL (ref 4.0–10.5)

## 2012-03-10 ENCOUNTER — Encounter: Payer: Self-pay | Admitting: Family Medicine

## 2012-03-10 ENCOUNTER — Ambulatory Visit (INDEPENDENT_AMBULATORY_CARE_PROVIDER_SITE_OTHER): Payer: 59 | Admitting: Family Medicine

## 2012-03-10 VITALS — BP 124/72 | HR 69 | Resp 18 | Ht 70.0 in | Wt 187.0 lb

## 2012-03-10 DIAGNOSIS — Z0000 Encounter for general adult medical examination without abnormal findings: Secondary | ICD-10-CM

## 2012-03-10 DIAGNOSIS — K219 Gastro-esophageal reflux disease without esophagitis: Secondary | ICD-10-CM

## 2012-03-10 DIAGNOSIS — I10 Essential (primary) hypertension: Secondary | ICD-10-CM

## 2012-03-10 DIAGNOSIS — B351 Tinea unguium: Secondary | ICD-10-CM

## 2012-03-10 DIAGNOSIS — E785 Hyperlipidemia, unspecified: Secondary | ICD-10-CM

## 2012-03-10 LAB — POC HEMOCCULT BLD/STL (OFFICE/1-CARD/DIAGNOSTIC): Fecal Occult Blood, POC: NEGATIVE

## 2012-03-10 MED ORDER — TERBINAFINE HCL 250 MG PO TABS: 250.0000 mg | ORAL_TABLET | Freq: Every day | ORAL | Status: DC

## 2012-03-10 MED ORDER — DEXLANSOPRAZOLE 30 MG PO CPDR: 30.0000 mg | DELAYED_RELEASE_CAPSULE | Freq: Every day | ORAL | Status: DC

## 2012-03-10 MED ORDER — ISOSORBIDE MONONITRATE ER 30 MG PO TB24: 30.0000 mg | ORAL_TABLET | Freq: Every day | ORAL | Status: DC

## 2012-03-10 NOTE — Assessment & Plan Note (Signed)
Crestor daily, recheck FLP in 2 months

## 2012-03-10 NOTE — Assessment & Plan Note (Signed)
Lamisil x 8 weeks, recheck LFT

## 2012-03-10 NOTE — Progress Notes (Signed)
Subjective:    Patient ID: Jonathan [name], male    DOB: 08-Jan-1961, 51 y.o.   MRN: [id number]  HPI   Patient here for complete physical exam. Medications and history reviewed. He was seen by his vascular surgeon with normal workup for claudication. Seen by cardiology his labs were reviewed. They have increase his Crestor to daily with hopes to get his LDL to 70. He is a stress test planned for August. He's been exercise on a regular basis. With P90X. He has tried walking again and he has not had a severe pain with walking his previous to He is concerned about a nail fungus   Review of Systems    GEN- denies fatigue, fever, weight loss,weakness, recent illness HEENT- denies eye drainage, change in vision, nasal discharge, CVS- denies chest pain, palpitations RESP- denies SOB, cough, wheeze ABD- denies N/V, change in stools, abd pain GU- denies dysuria, hematuria, dribbling, incontinence MSK- denies joint pain, muscle aches, injury Neuro- denies headache, dizziness, syncope, seizure activity    Objective:   Physical Exam GEN- NAD, alert and oriented x3,  HEENT- PERRL, EOMI, non injected sclera, pink conjunctiva, MMM, oropharynx clear, very poor dentition, TM clear bilat Neck- Supple, no bruit CVS- RRR, no murmur RESP-CTAB ABD- NABS, soft, NT, ND EXT- No edema Rectum- normal tone, prostate smooth, FOBT neg Pulses- Radial, DP- 2+ Psych- Not depressed or overly anxious appearing Right great toenail- yellow discoloration with thickening, mild discoloration to left great toenail      Assessment & Plan:    CPE- looks well overall, Labs reviewed, PSA, neg, pt had TDAP last year, he will check with insurance regarding shingles vaccine, colonoscopy UTD  +exercise

## 2012-03-10 NOTE — Patient Instructions (Signed)
Start the Lamisil for the toenail fungus Get your lipids rechecked in 2 months We will call with results Continue other meds Trial of dexilant for acid reflux Call about the shingles vaccine F/U 3 months

## 2012-03-10 NOTE — Assessment & Plan Note (Signed)
Change nexium to dexilant because of interaction with plavix

## 2012-05-03 IMAGING — CR DG ABDOMEN 2V
2 series · 2 of 2 positions shown · non-contrast
Comparison: 12/08/2010

CLINICAL DATA: Chest pain, constipation.

ABDOMEN - 2 VIEW

[w abdomen upright]
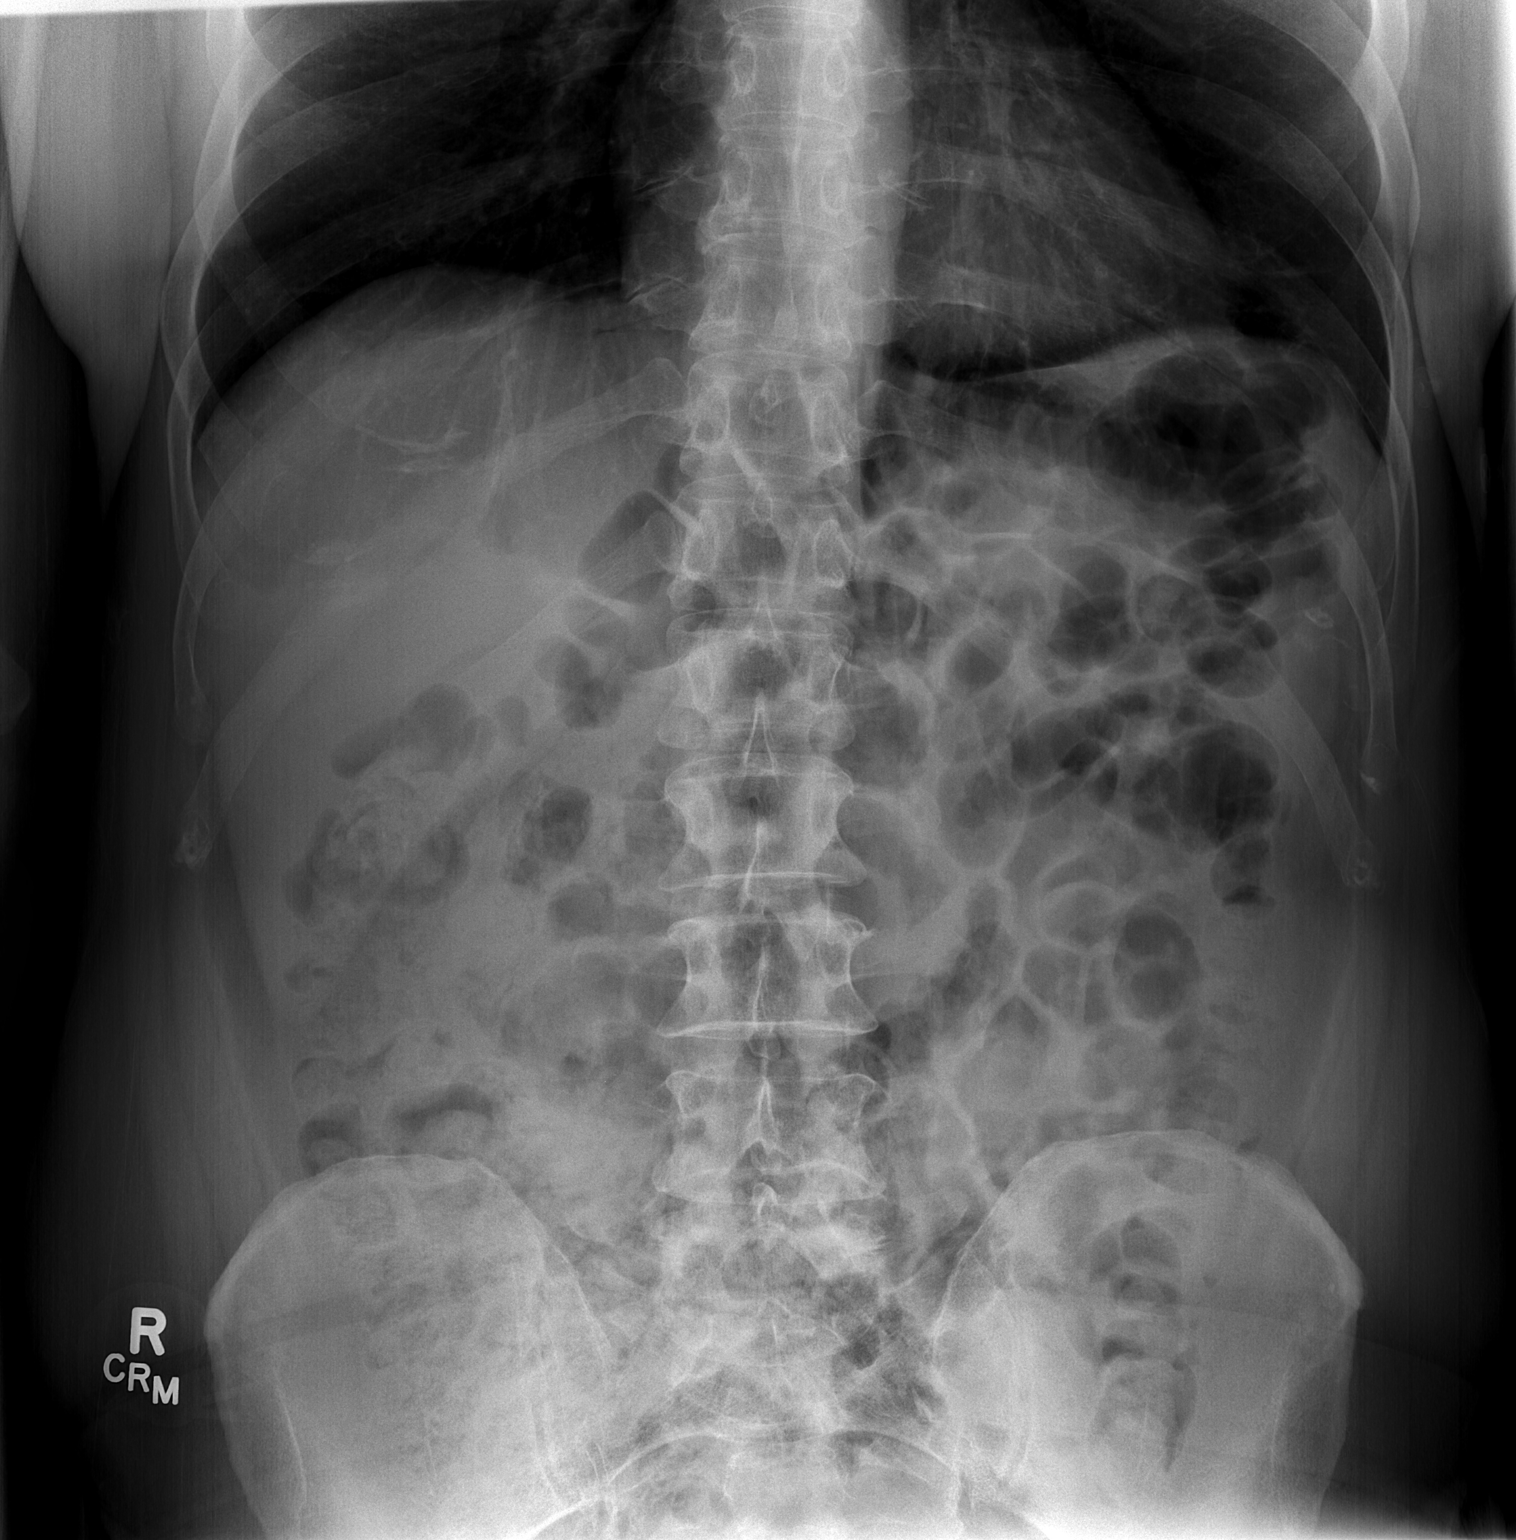

[t abdomen supine]
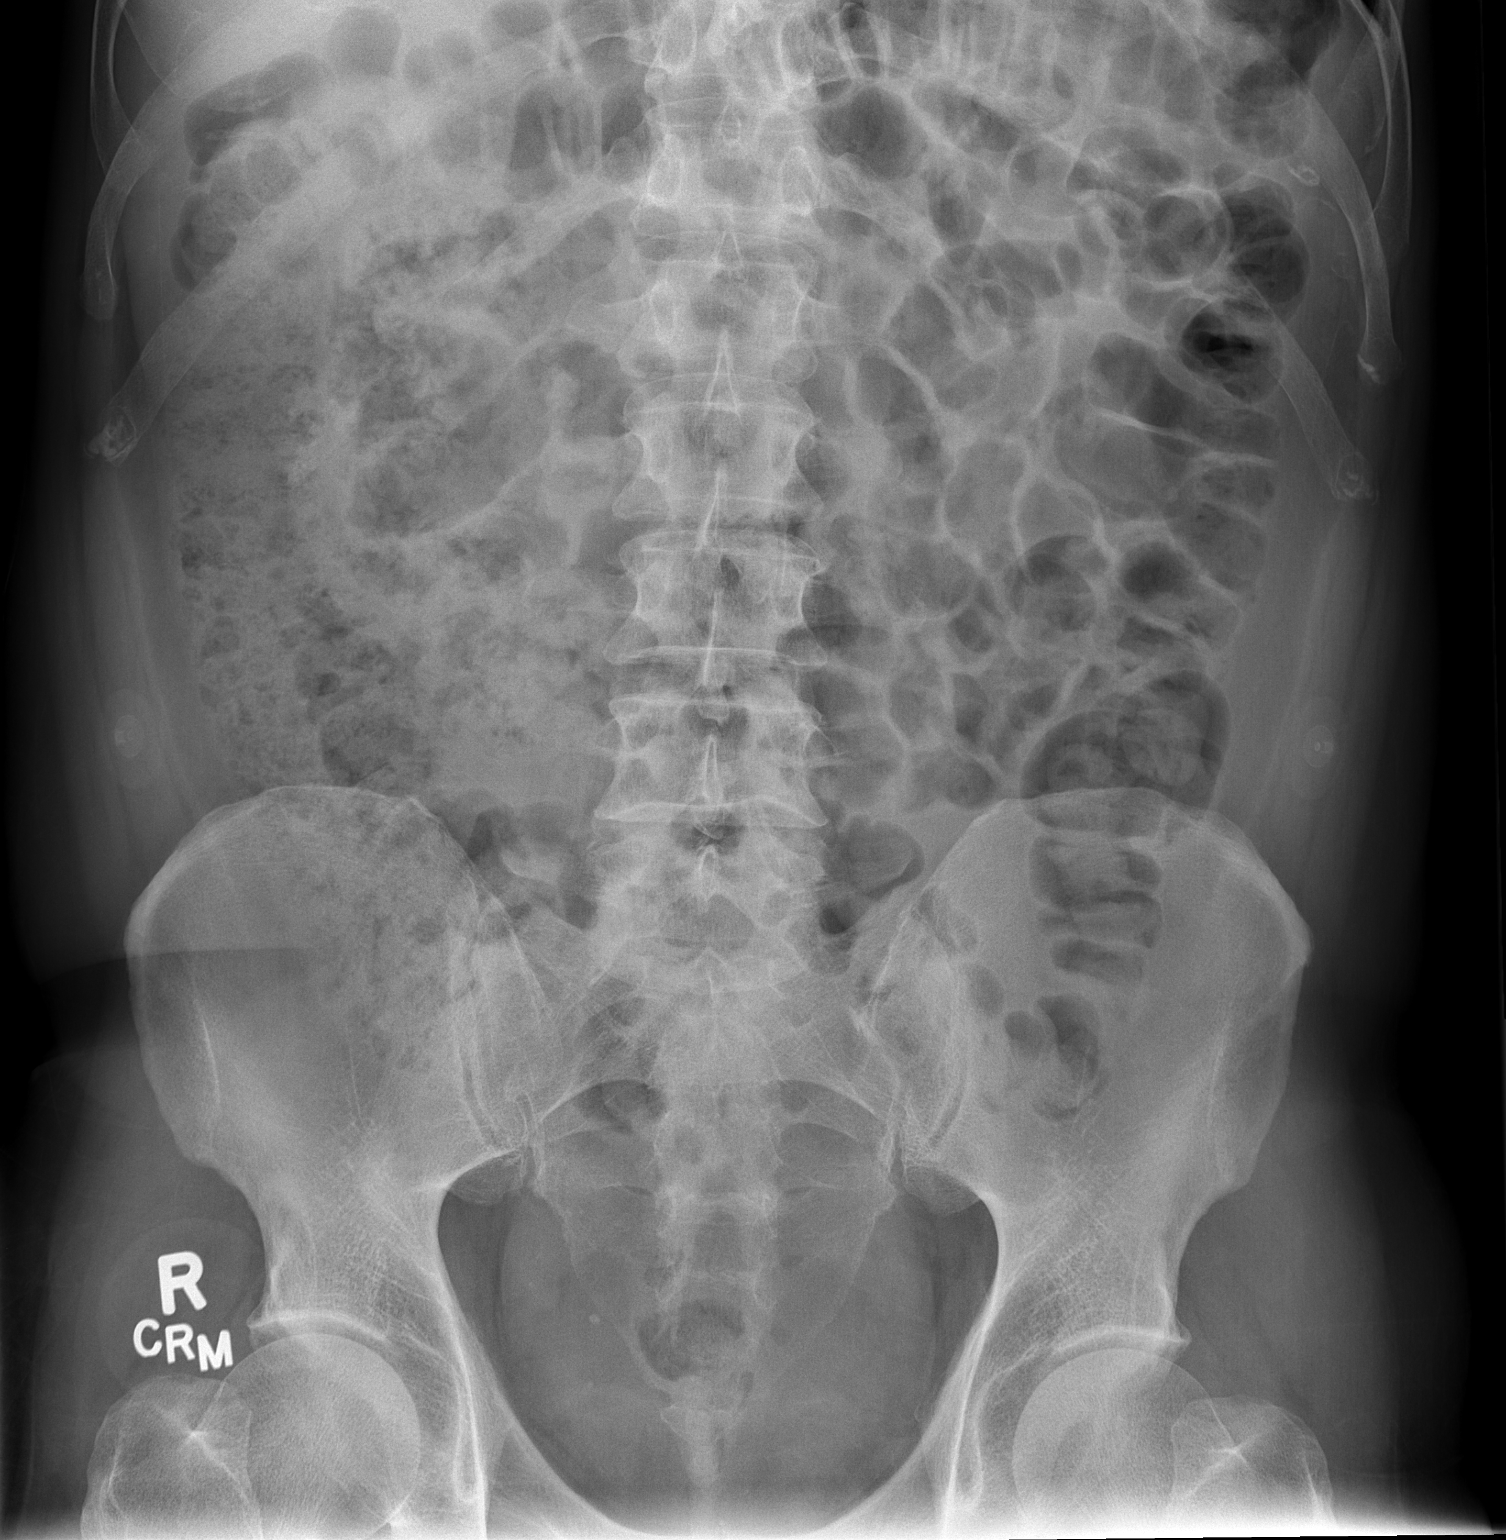

[2 of 2 positions shown; findings below may reference images not displayed]

FINDINGS: Moderate to large stool burden again seen throughout the
colon, similar to prior study.  There is a nonobstructive bowel gas
pattern.  Gas throughout nondistended large and small bowel.  No
organomegaly or suspicious calcification.
IMPRESSION: Moderate to large stool burden.  No real change.

## 2012-05-19 LAB — COMPREHENSIVE METABOLIC PANEL
ALT: 15 U/L (ref 0–53)
Albumin: 4.3 g/dL (ref 3.5–5.2)
CO2: 23 mEq/L (ref 19–32)
Calcium: 9.2 mg/dL (ref 8.4–10.5)
Chloride: 104 mEq/L (ref 96–112)
Sodium: 137 mEq/L (ref 135–145)
Total Protein: 5.9 g/dL — ABNORMAL LOW (ref 6.0–8.3)

## 2012-05-19 LAB — LIPID PANEL
LDL Cholesterol: 64 mg/dL (ref 0–99)
Triglycerides: 59 mg/dL (ref <150)

## 2012-07-09 ENCOUNTER — Ambulatory Visit: Payer: 59 | Admitting: Family Medicine

## 2012-07-16 ENCOUNTER — Ambulatory Visit: Payer: 59 | Admitting: Family Medicine

## 2012-07-16 DIAGNOSIS — Z9289 Personal history of other medical treatment: Secondary | ICD-10-CM

## 2012-07-16 HISTORY — DX: Personal history of other medical treatment: Z92.89

## 2012-07-28 ENCOUNTER — Ambulatory Visit (INDEPENDENT_AMBULATORY_CARE_PROVIDER_SITE_OTHER): Payer: 59 | Admitting: Family Medicine

## 2012-07-28 ENCOUNTER — Encounter: Payer: Self-pay | Admitting: Family Medicine

## 2012-07-28 VITALS — BP 140/78 | HR 60 | Resp 18 | Ht 70.0 in | Wt 184.0 lb

## 2012-07-28 DIAGNOSIS — I10 Essential (primary) hypertension: Secondary | ICD-10-CM

## 2012-07-28 DIAGNOSIS — R3129 Other microscopic hematuria: Secondary | ICD-10-CM

## 2012-07-28 DIAGNOSIS — E785 Hyperlipidemia, unspecified: Secondary | ICD-10-CM

## 2012-07-28 DIAGNOSIS — K219 Gastro-esophageal reflux disease without esophagitis: Secondary | ICD-10-CM

## 2012-07-28 DIAGNOSIS — R319 Hematuria, unspecified: Secondary | ICD-10-CM

## 2012-07-28 DIAGNOSIS — I2510 Atherosclerotic heart disease of native coronary artery without angina pectoris: Secondary | ICD-10-CM

## 2012-07-28 LAB — POCT URINALYSIS DIPSTICK
Bilirubin, UA: NEGATIVE
Leukocytes, UA: NEGATIVE
Nitrite, UA: NEGATIVE
Protein, UA: NEGATIVE
Urobilinogen, UA: 0.2
pH, UA: 7

## 2012-07-28 NOTE — Patient Instructions (Addendum)
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

## 2012-07-28 NOTE — Progress Notes (Signed)
Subjective:    Patient ID: Jonathan [name], male    DOB: [birth year], 51 y.o.   MRN: [id number]  HPI  Pt here for routine follow-up. No specific concerns S/P stress testing by cardiologist, asked me to review results with him.  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  Medications reviewed [name] for flu shot in Oct   Review of Systems   GEN- denies fatigue, fever, weight loss,weakness, recent illness HEENT- denies eye drainage, change in vision, nasal discharge, CVS- denies chest pain, palpitations RESP- denies SOB, cough, wheeze ABD- denies N/V, change in stools, abd pain GU- denies dysuria, hematuria, dribbling, incontinence MSK- denies joint pain, muscle aches, injury Neuro- denies headache, dizziness, syncope, seizure activity      Objective:   Physical Exam GEN- NAD, alert and oriented x3, pleasant HEENT- PERRL, EOMI, non injected sclera, pink conjunctiva, MMM, oropharynx clear,  poor dentition CVS- RRR, no murmur RESP-CTAB ABD- NABS, soft, NT, ND EXT- No edema Pulses- Radial 2+         Assessment & [name]:  [facility] Fax [phone]  Jonathan [name]

## 2012-07-29 DIAGNOSIS — I10 Essential (primary) hypertension: Secondary | ICD-10-CM | POA: Insufficient documentation

## 2012-07-29 DIAGNOSIS — R3129 Other microscopic hematuria: Secondary | ICD-10-CM | POA: Insufficient documentation

## 2012-07-29 NOTE — Assessment & Plan Note (Signed)
FLP, LDL at goal

## 2012-07-29 NOTE — Assessment & Plan Note (Signed)
Well controlled, no change to meds 

## 2012-07-29 NOTE — Assessment & Plan Note (Signed)
Doing well on PPI. 

## 2012-07-29 NOTE — Assessment & Plan Note (Signed)
Reviewed stress test results with pt and previous for comparison, he is overall doing well

## 2012-07-29 NOTE — Assessment & Plan Note (Signed)
Repeat UA negative for blood. No further work-up needed at this time

## 2012-08-21 ENCOUNTER — Other Ambulatory Visit: Payer: Self-pay

## 2012-08-25 ENCOUNTER — Encounter: Payer: Self-pay | Admitting: Vascular Surgery

## 2012-08-26 ENCOUNTER — Ambulatory Visit (INDEPENDENT_AMBULATORY_CARE_PROVIDER_SITE_OTHER): Payer: 59 | Admitting: *Deleted

## 2012-08-26 ENCOUNTER — Ambulatory Visit (INDEPENDENT_AMBULATORY_CARE_PROVIDER_SITE_OTHER): Payer: 59 | Admitting: Vascular Surgery

## 2012-08-26 ENCOUNTER — Encounter: Payer: Self-pay | Admitting: Vascular Surgery

## 2012-08-26 VITALS — BP 128/79 | HR 63 | Resp 18 | Ht 70.0 in | Wt 186.7 lb

## 2012-08-26 DIAGNOSIS — R0989 Other specified symptoms and signs involving the circulatory and respiratory systems: Secondary | ICD-10-CM

## 2012-08-26 NOTE — Progress Notes (Signed)
[facility] of   Patient name: Jonathan [name] MRN: [id number] DOB: 1961-01-17 Sex: male  REASON FOR VISIT: bilateral femoral bruits. Referred by Dr. Merrilee Seashore  HPI: Jonathan [name] is a 51 y.o. male who I saw in consultation on 01/08/2012 with left leg pain. At that time he had palpable pedal pulses and normal ABIs. I did not think that his symptoms in his left eye were related to peripheral vascular disease. The pain sounded musculoskeletal in origin. I was planning to see him back as needed.  He was evaluated by Dr. Lyn Hollingshead and found have bilateral femoral bruits. He was sent for follow up. He states that the pain that he was having in his left thigh has resolved. He denies claudication, rest pain, or nonhealing ulcers. There has been no significant change in his medical history.  Past Medical History  Diagnosis Date  . Coronary artery disease   . Simvastatin-induced rhabdomyolysis   . Hyperlipidemia   . Substance abuse     > 20 years ago, marijauna, cocaine, hash  . Abnormal stress test 2011    SEHV-EF 59%, Moderate perfusion defect due to scar, mild periinfarct  . Echocardiogram 2011    mild posterior wall hypokinesis  . Peripheral vascular disease   . Myocardial infarction nov 2010    [name]  . Hypertension     Family History  Problem Relation Age of Onset  . Hypertension Mother   . Hyperlipidemia Mother   . Hypertension Father   . Hyperlipidemia Father   . Cancer Father     kidney  . Hypertension Brother   . Hyperlipidemia Brother   . Hypertension Sister   . Hyperlipidemia Sister     SOCIAL HISTORY: History  Substance Use Topics  . Smoking status: Former Smoker    Quit date: 01/07/2007  . Smokeless tobacco: Not on file  . Alcohol Use: Yes    Allergies  Allergen Reactions  . Pantoprazole Other (See Comments)    More stomach problems    Current Outpatient Prescriptions  Medication Sig Dispense Refill  .  Ascorbic Acid (VITAMIN C) 1000 MG tablet Take 1,000 mg by mouth 2 (two) times daily.        [name] aspirin EC 81 MG tablet Take 81 mg by mouth daily.        . B Complex-C (SUPER B COMPLEX PO) Take 1 tablet by mouth daily.        . clopidogrel (PLAVIX) 75 MG tablet Take 75 mg by mouth daily.        . Coenzyme Q10 (CO Q 10) 100 MG CAPS Take 1 capsule by mouth daily.        [name] Dexlansoprazole 30 MG capsule Take 30 mg by mouth daily.      . Ginger, Zingiber officinalis, 550 MG CAPS Take 1 capsule by mouth daily.        . isosorbide mononitrate (IMDUR) 30 MG 24 hr tablet Take 1 tablet (30 mg total) by mouth daily.  90 tablet  1  . lisinopril (PRINIVIL,ZESTRIL) 10 MG tablet Take 10 mg by mouth daily.        . Magnesium 250 MG TABS Take 1 tablet by mouth daily.        . Multiple Vitamin (MULITIVITAMIN WITH MINERALS) TABS Take 1 tablet by mouth daily.        . nebivolol (BYSTOLIC) 5 MG tablet Take 5 mg by mouth daily.        [name]  niacin (NIASPAN) 500 MG CR tablet Take 500 mg by mouth at bedtime.        . nitroGLYCERIN (NITROSTAT) 0.4 MG SL tablet Place 0.4 mg under the tongue every 5 (five) minutes as needed. For chest pains       . Omega-3 Fatty Acids (FISH OIL) 1200 MG CAPS Take 1 capsule by mouth daily.        . rosuvastatin (CRESTOR) 10 MG tablet Take 10 mg by mouth daily.        . Saw Palmetto, Serenoa repens, (SAW PALMETTO BERRIES PO) Take 1 tablet by mouth daily.        . vitamin E 400 UNIT capsule Take 400 Units by mouth daily.        . Zinc 50 MG TABS Take 1 tablet by mouth daily.        [name] terbinafine (LAMISIL) 250 MG tablet Take 1 tablet (250 mg total) by mouth daily.  60 tablet  0    REVIEW OF SYSTEMS: Arly.Keller ] denotes positive finding; [  ] denotes negative finding  CARDIOVASCULAR:  [ ]  chest pain   [ ]  chest pressure   [ ]  palpitations   [ ]  orthopnea   [ ]  dyspnea on exertion   [ ]  claudication   [ ]  rest pain   [ ]  DVT   [ ]  phlebitis PULMONARY:   [ ]  productive cough   [ ]  asthma   [ ]   wheezing NEUROLOGIC:   [ ]  weakness  [ ]  paresthesias  [ ]  aphasia  [ ]  amaurosis  [ ]  dizziness HEMATOLOGIC:   [ ]  bleeding problems   [ ]  clotting disorders MUSCULOSKELETAL:  [ ]  joint pain   [ ]  joint swelling [ ]  leg swelling GASTROINTESTINAL: [ ]   blood in stool  [ ]   hematemesis GENITOURINARY:  [ ]   dysuria  [ ]   hematuria PSYCHIATRIC:  [ ]  history of major depression INTEGUMENTARY:  [ ]  rashes  [ ]  ulcers CONSTITUTIONAL:  [ ]  fever   [ ]  chills  PHYSICAL EXAM: Filed Vitals:   08/26/12 1101  BP: 128/79  Pulse: 63  Resp: 18  Height: 5\' 10"  (1.778 m)  Weight: 186 lb 11.2 oz (84.687 kg)   Body mass index is 26.79 kg/(m^2). GENERAL: The patient is a well-nourished male, in no acute distress. The vital signs are documented above. CARDIOVASCULAR: There is a regular rate and rhythm. I do not detect carotid bruits. He has palpable femoral, popliteal, dorsalis pedis, and posterior tibial pulses bilaterally. He does have soft bilateral femoral bruits. PULMONARY: There is good air exchange bilaterally without wheezing or rales. ABDOMEN: Soft and non-tender with normal pitched bowel sounds.  MUSCULOSKELETAL: There are no major deformities or cyanosis. NEUROLOGIC: No focal weakness or paresthesias are detected. SKIN: There are no ulcers or rashes noted. PSYCHIATRIC: The patient has a normal affect.  DATA:  I independently interpreted his arterial Doppler study today which shows normal ABIs bilaterally 100%. He has triphasic Doppler signals in the posterior tibial and anterior tibial positions bilaterally.  MEDICAL ISSUES: Based on his exam and arterial Doppler study there is no evidence of infrainguinal arterial occlusive disease. I do not think if the soft bilateral femoral bruits are significant. I'll see him back as needed  Tivis Wherry S [facility] [place] Beeper: [phone]

## 2012-10-29 ENCOUNTER — Other Ambulatory Visit: Payer: Self-pay | Admitting: Family Medicine

## 2013-01-28 ENCOUNTER — Other Ambulatory Visit: Payer: Self-pay | Admitting: Family Medicine

## 2013-02-08 ENCOUNTER — Encounter: Payer: Self-pay | Admitting: Family Medicine

## 2013-02-08 ENCOUNTER — Ambulatory Visit (INDEPENDENT_AMBULATORY_CARE_PROVIDER_SITE_OTHER): Payer: 59 | Admitting: Family Medicine

## 2013-02-08 VITALS — BP 128/68 | HR 66 | Resp 18 | Ht 70.0 in | Wt 197.1 lb

## 2013-02-08 DIAGNOSIS — N529 Male erectile dysfunction, unspecified: Secondary | ICD-10-CM

## 2013-02-08 DIAGNOSIS — I2510 Atherosclerotic heart disease of native coronary artery without angina pectoris: Secondary | ICD-10-CM

## 2013-02-08 DIAGNOSIS — E785 Hyperlipidemia, unspecified: Secondary | ICD-10-CM

## 2013-02-08 DIAGNOSIS — I10 Essential (primary) hypertension: Secondary | ICD-10-CM

## 2013-02-08 DIAGNOSIS — Z125 Encounter for screening for malignant neoplasm of prostate: Secondary | ICD-10-CM

## 2013-02-08 MED ORDER — SILDENAFIL CITRATE 100 MG PO TABS: 100.0000 mg | ORAL_TABLET | ORAL | Status: DC | PRN

## 2013-02-08 NOTE — Progress Notes (Signed)
Subjective:    Patient ID: Jonathan [name], male    DOB: 24-May-1961, 52 y.o.   MRN: [id number]  HPI  Pt here to f/u chronic medical problems. Has had difficult with erectile dysfunction since his heart attack, asking about use of medications, had difficulty getting and maintaining and erection, no premature ejaculation.  Abdominal discomforts have resolved after he stopped eating applesauce Currently on antibiotics and pain meds from dentist    Review of Systems   GEN- denies fatigue, fever, weight loss,weakness, recent illness HEENT- denies eye drainage, change in vision, nasal discharge, CVS- denies chest pain, palpitations RESP- denies SOB, cough, wheeze ABD- denies N/V, change in stools, abd pain GU- denies dysuria, hematuria, dribbling, incontinence MSK- denies joint pain, muscle aches, injury Neuro- denies headache, dizziness, syncope, seizure activity      Objective:   Physical Exam GEN- NAD, alert and oriented x3 HEENT- PERRL, EOMI, non injected sclera, pink conjunctiva, MMM, oropharynx clear Neck- Supple, no bruit CVS- RRR, no murmur RESP-CTAB ABD-NABS,soft,NT,ND EXT- No edema Pulses- Radial, DP- 2+        Assessment & Plan:

## 2013-02-08 NOTE — Patient Instructions (Addendum)
Get the labs done fasting before next visit  Medications refilled  I will send a copy of labs to Dr. Tresa Endo Check into shingles vaccine F/U 3 months- Physical Exam

## 2013-02-09 ENCOUNTER — Encounter: Payer: Self-pay | Admitting: Family Medicine

## 2013-02-09 DIAGNOSIS — N529 Male erectile dysfunction, unspecified: Secondary | ICD-10-CM | POA: Insufficient documentation

## 2013-02-09 NOTE — Assessment & Plan Note (Signed)
Well controlled 

## 2013-02-09 NOTE — Assessment & Plan Note (Addendum)
Pt was under the impression he was not continuing IMDUR, however after consulting with cardiology they plan to continue and no recommendation for discontinuing I have spoken with urology, there are no other options for medications because of side effects with the PDE ,  Testosterone will be checked He understands he can not use the Viagra coupon given He agrees to urology to discuss possible penile pumps or injections

## 2013-02-09 NOTE — Assessment & Plan Note (Signed)
Plan to check FLP, forward to cardiology [facility]

## 2013-02-09 NOTE — Assessment & Plan Note (Addendum)
Doing well no CP, no SOB, continue current meds, fasting labs to be done He has actually been on IMDUR since 2010 per cardiology notes

## 2013-03-15 ENCOUNTER — Other Ambulatory Visit: Payer: Self-pay | Admitting: Family Medicine

## 2013-03-15 LAB — CBC
Platelets: 256 10*3/uL (ref 150–400)
RBC: 4.93 MIL/uL (ref 4.22–5.81)
RDW: 13.3 % (ref 11.5–15.5)
WBC: 4.8 10*3/uL (ref 4.0–10.5)

## 2013-03-15 LAB — COMPREHENSIVE METABOLIC PANEL
Albumin: 4.6 g/dL (ref 3.5–5.2)
CO2: 28 mEq/L (ref 19–32)
Chloride: 104 mEq/L (ref 96–112)
Glucose, Bld: 90 mg/dL (ref 70–99)
Potassium: 5.5 mEq/L — ABNORMAL HIGH (ref 3.5–5.3)
Sodium: 139 mEq/L (ref 135–145)
Total Protein: 6.4 g/dL (ref 6.0–8.3)

## 2013-03-16 ENCOUNTER — Ambulatory Visit (INDEPENDENT_AMBULATORY_CARE_PROVIDER_SITE_OTHER): Payer: 59 | Admitting: Urology

## 2013-03-16 DIAGNOSIS — N529 Male erectile dysfunction, unspecified: Secondary | ICD-10-CM

## 2013-03-16 LAB — PSA: PSA: 2.26 ng/mL (ref <=4.00)

## 2013-03-17 LAB — BILIRUBIN, FRACTIONATED(TOT/DIR/INDIR)
Bilirubin, Direct: 0.3 mg/dL (ref 0.0–0.3)
Indirect Bilirubin: 1.7 mg/dL — ABNORMAL HIGH (ref 0.0–0.9)
Total Bilirubin: 2 mg/dL — ABNORMAL HIGH (ref 0.3–1.2)

## 2013-04-20 ENCOUNTER — Ambulatory Visit (INDEPENDENT_AMBULATORY_CARE_PROVIDER_SITE_OTHER): Payer: 59 | Admitting: Urology

## 2013-04-20 DIAGNOSIS — N529 Male erectile dysfunction, unspecified: Secondary | ICD-10-CM

## 2013-05-03 ENCOUNTER — Telehealth: Payer: Self-pay | Admitting: Family Medicine

## 2013-05-03 MED ORDER — ISOSORBIDE MONONITRATE ER 30 MG PO TB24: ORAL_TABLET | ORAL | Status: DC

## 2013-05-03 NOTE — Telephone Encounter (Signed)
Med refilled.

## 2013-05-31 ENCOUNTER — Encounter: Payer: Self-pay | Admitting: *Deleted

## 2013-05-31 ENCOUNTER — Encounter: Payer: Self-pay | Admitting: Cardiovascular Disease

## 2013-06-01 ENCOUNTER — Ambulatory Visit (INDEPENDENT_AMBULATORY_CARE_PROVIDER_SITE_OTHER): Payer: 59 | Admitting: Cardiovascular Disease

## 2013-06-01 ENCOUNTER — Encounter: Payer: Self-pay | Admitting: Cardiovascular Disease

## 2013-06-01 VITALS — BP 110/68 | HR 58 | Ht 70.0 in | Wt 188.6 lb

## 2013-06-01 DIAGNOSIS — E782 Mixed hyperlipidemia: Secondary | ICD-10-CM

## 2013-06-01 DIAGNOSIS — E785 Hyperlipidemia, unspecified: Secondary | ICD-10-CM

## 2013-06-01 DIAGNOSIS — I2510 Atherosclerotic heart disease of native coronary artery without angina pectoris: Secondary | ICD-10-CM

## 2013-06-01 DIAGNOSIS — E875 Hyperkalemia: Secondary | ICD-10-CM

## 2013-06-01 DIAGNOSIS — I119 Hypertensive heart disease without heart failure: Secondary | ICD-10-CM

## 2013-06-01 LAB — BASIC METABOLIC PANEL
BUN: 16 mg/dL (ref 6–23)
Creat: 0.96 mg/dL (ref 0.50–1.35)
Potassium: 5.3 mEq/L (ref 3.5–5.3)

## 2013-06-01 MED ORDER — ROSUVASTATIN CALCIUM 20 MG PO TABS: 20.0000 mg | ORAL_TABLET | Freq: Every day | ORAL | Status: DC

## 2013-06-01 NOTE — Progress Notes (Signed)
Patient ID: Jonathan [name], male   DOB: 10-11-1961, 52 y.o.   MRN: [id number]     HPI: Jonathan [name], is a 52 y.o. male presents to the office to for nine-month cardiology evaluation.  Jonathan [name] suffered an acute coronary syndrome in November 2010 and was found to have totally occluded left circumflex marginal vessel at catheterization. A 2.75x18 mm vision stent was inserted by me with reestablishment of TIMI-3 flow, post dilated to 3 mm. He had a repeat catheterization by Dr. Garret Reddish in January 2012 and a stent was widely patent. He did have mild narrowing in the LAD. Additional problems include hyperlipidemia as well as hypertension. A nuclear perfusion study in August 2013 was unchanged from previously and showed a small area of mid to basal inferolateral scar. There was no ischemia. Post-rest ejection fraction was 67%.  Jonathan [name] has been having some difficulty with his stomach. Apparently he was told that this was due to excessive alcohol intake; and  since he has discontinued eating apples and applesauce, his stomach discomfort has improved.   Jonathan [name] denies recent chest pain. He denies shortness of breath. He recently had laboratory done roughly 2 months ago. He supraclitoral 182 triglycerides 51 HDL 71. His LDL had risen from 64-101. Chemistry revealed a potassium of 5.5 which was elevated. CBC was normal.  Past Medical History  Diagnosis Date  . Coronary artery disease   . Simvastatin-induced rhabdomyolysis   . Hyperlipidemia   . Substance abuse     > 20 years ago, marijauna, cocaine, hash  . Abnormal stress test 2011    SEHV-EF 59%, Moderate perfusion defect due to scar, mild periinfarct  . Echocardiogram 2011    mild posterior wall hypokinesis  . Peripheral vascular disease   . Myocardial infarction nov 2010    Jonathan [name]  . Hypertension   . History of stress test 07/16/2012    abnormal myocardial perfusion study. No significant change from  08/03/2010 study, low risk scan.  [name] Hx of echocardiogram 12/14/2010    Past Surgical History  Procedure Laterality Date  . Coronary stent placement  10/06/2009    Successful percutaneous coronary intervention of the circumflex marginal-2 vessel with percutaneous transluminal coronary angioplasty, stenting with a 2.75 x 18 mm non-drug eluting stent Vision Stent post-dilated to 3.0 mm.  . Cardiac catheterization  12/14/2010    By Dr [name] showed a widely patent circumflex stent with mild plaque in his LAD.    Allergies  Allergen Reactions  . Other     apples  . Pantoprazole Other (See Comments)    More stomach problems    Current Outpatient Prescriptions  Medication Sig Dispense Refill  . Ascorbic Acid (VITAMIN C) 1000 MG tablet Take 1,000 mg by mouth 2 (two) times daily.        [name] aspirin EC 81 MG tablet Take 81 mg by mouth daily.        . B Complex-C (SUPER B COMPLEX PO) Take 1 tablet by mouth daily.        . clopidogrel (PLAVIX) 75 MG tablet Take 75 mg by mouth daily.        . Coenzyme Q10 (CO Q 10) 100 MG CAPS Take 1 capsule by mouth daily.        [name] Dexlansoprazole 30 MG capsule Take 30 mg by mouth daily.      . Ginger, Zingiber officinalis, 550 MG CAPS Take 1 capsule by mouth daily.        [name]  isosorbide mononitrate (IMDUR) 30 MG 24 hr tablet TAKE ONE TABLET BY MOUTH EVERY DAY  90 tablet  0  . lisinopril (PRINIVIL,ZESTRIL) 10 MG tablet Take 10 mg by mouth daily.        . Magnesium 250 MG TABS Take 1 tablet by mouth daily.        . Multiple Vitamin (MULITIVITAMIN WITH MINERALS) TABS Take 1 tablet by mouth daily.        . nebivolol (BYSTOLIC) 5 MG tablet Take 5 mg by mouth daily.        . niacin (NIASPAN) 500 MG CR tablet Take 500 mg by mouth at bedtime.        . nitroGLYCERIN (NITROSTAT) 0.4 MG SL tablet Place 0.4 mg under the tongue every 5 (five) minutes as needed. For chest pains       . Omega-3 Fatty Acids (FISH OIL) 1200 MG CAPS Take 1 capsule by mouth daily.        . Saw  Palmetto, Serenoa repens, (SAW PALMETTO BERRIES PO) Take 1 tablet by mouth daily.        . vitamin E 400 UNIT capsule Take 400 Units by mouth daily.        . Zinc 50 MG TABS Take 1 tablet by mouth daily.        . rosuvastatin (CRESTOR) 20 MG tablet Take 1 tablet (20 mg total) by mouth daily.  90 tablet  3   No current facility-administered medications for this visit.    Socially he is married for 28 years. He has 3 children 2 grandchildren. He completed 12th grade education. Works as a [employer]. He has a prior 30 year history of tobacco use but he quit in August 2008. He does drink occasional alcohol.  ROS is negative for fevers, chills or night sweats. He denies PND or orthopnea. He denies myalgias. He denies wheezing. He denies chest pain. He did have significant stomach discomfort and was eating significant amount of apples as well as applesauce daily. This has improved with discontinuance. He denies myalgias. He denies arthralgias. He denies significant edema.  Other system review is negative.  PE BP 110/68  Pulse 58  Ht 5\' 10"  (1.778 m)  Wt 188 lb 9.6 oz (85.548 kg)  BMI 27.06 kg/m2  General: Alert, oriented, no distress.  Skin: normal turgor, no rashes HEENT: Normocephalic, atraumatic. Pupils round and reactive; sclera anicteric;no lid lag.  Nose without nasal septal hypertrophy Mouth/Parynx benign; Mallinpatti scale 2 Neck: No JVD, no carotid briuts Lungs: clear to ausculatation and percussion; no wheezing or rales Heart: RRR, s1 s2 normal over 6 systolic murmur. Abdomen: soft, nontender; no hepatosplenomehaly, BS+; abdominal aorta nontender and not dilated by palpation. Pulses 2+ Extremities: no clubbing cyanosis or edema, Homan's sign negative  Neurologic: grossly nonfocal  ECG: Sinus rhythm 58 beats per minute. No significant ST changes.  LABS:  BMET    Component Value Date/Time   NA 139 03/15/2013 1200   K 5.5* 03/15/2013 1200   CL 104 03/15/2013 1200   CO2 28  03/15/2013 1200   GLUCOSE 90 03/15/2013 1200   BUN 13 03/15/2013 1200   CREATININE 0.97 03/15/2013 1200   CREATININE 0.66 11/26/2011 1203   CALCIUM 9.6 03/15/2013 1200   GFRNONAA >90 11/26/2011 1203   GFRAA >90 11/26/2011 1203     Hepatic Function Panel     Component Value Date/Time   PROT 6.4 03/15/2013 1200   ALBUMIN 4.6 03/15/2013 1200   AST 18  03/15/2013 1200   ALT 19 03/15/2013 1200   ALKPHOS 55 03/15/2013 1200   BILITOT 2.0* 03/15/2013 1200   BILITOT 2.0* 03/15/2013 1200   BILIDIR 0.3 03/15/2013 1200   IBILI 1.7* 03/15/2013 1200     CBC    Component Value Date/Time   WBC 4.8 03/15/2013 1200   RBC 4.93 03/15/2013 1200   HGB 15.3 03/15/2013 1200   HCT 44.4 03/15/2013 1200   PLT 256 03/15/2013 1200   MCV 90.1 03/15/2013 1200   MCH 31.0 03/15/2013 1200   MCHC 34.5 03/15/2013 1200   RDW 13.3 03/15/2013 1200   LYMPHSABS 1.5 12/13/2010 2149   MONOABS 0.5 12/13/2010 2149   EOSABS 0.0 12/13/2010 2149   BASOSABS 0.0 12/13/2010 2149     BNP No results found for this basename: probnp    Lipid Panel     Component Value Date/Time   CHOL 182 03/15/2013 1200   TRIG 51 03/15/2013 1200   HDL 71 03/15/2013 1200   CHOLHDL 2.6 03/15/2013 1200   VLDL 10 03/15/2013 1200   LDLCALC 101* 03/15/2013 1200     RADIOLOGY: No results found.    ASSESSMENT AND PLAN: Mr. Ploski is now almost 4 years status post his acute cardiac syndrome secondary to total circumflex marginal occlusion which was successfully intervened upon. At his last catheterization he had mild smooth in-stent narrowing of less than 30%. His most recent lipid panel does show an increase in his LDL cholesterol from 64-101. I recommended further titration of his Crestor to 20 mg per gram rechecking it be met today to reassess his potassium level. In 3 months she will undergo a CMET and lipid panel. I will see him in 6 months for cardiology followup evaluation.     Lennette Bihari, MD, [facility]  06/01/2013 10:59 AM

## 2013-06-01 NOTE — Patient Instructions (Addendum)
Your physician recommends that you return for lab work   B-MET TODAY.  C-MET and LIPID in 3 MONTHS.  Your physician has recommended you make the following change in your medication: INCREASE YOUR CRESTOR TO 20 MG DAILY.( Patient was informed by Dr. Tresa Endo to take 15mg  of isosorbide for 2 days then stop.)  Your physician recommends that you schedule a follow-up appointment in: 6 MONTHS

## 2013-06-03 NOTE — Progress Notes (Signed)
Quick Note:    Released to mychart.

## 2013-06-15 ENCOUNTER — Encounter: Payer: Self-pay | Admitting: Family Medicine

## 2013-06-15 ENCOUNTER — Ambulatory Visit (INDEPENDENT_AMBULATORY_CARE_PROVIDER_SITE_OTHER): Payer: 59 | Admitting: Family Medicine

## 2013-06-15 VITALS — BP 110/80 | HR 80 | Temp 98.9°F | Resp 18 | Ht 69.5 in | Wt 190.0 lb

## 2013-06-15 DIAGNOSIS — Z0000 Encounter for general adult medical examination without abnormal findings: Secondary | ICD-10-CM

## 2013-06-15 DIAGNOSIS — I10 Essential (primary) hypertension: Secondary | ICD-10-CM

## 2013-06-15 DIAGNOSIS — N529 Male erectile dysfunction, unspecified: Secondary | ICD-10-CM

## 2013-06-15 MED ORDER — NEBIVOLOL HCL 5 MG PO TABS: 5.0000 mg | ORAL_TABLET | Freq: Every day | ORAL | Status: DC

## 2013-06-15 MED ORDER — CLOPIDOGREL BISULFATE 75 MG PO TABS: 75.0000 mg | ORAL_TABLET | Freq: Every day | ORAL | Status: DC

## 2013-06-15 MED ORDER — NIACIN ER (ANTIHYPERLIPIDEMIC) 500 MG PO TBCR: 500.0000 mg | EXTENDED_RELEASE_TABLET | Freq: Every day | ORAL | Status: DC

## 2013-06-15 MED ORDER — LISINOPRIL 10 MG PO TABS: 10.0000 mg | ORAL_TABLET | Freq: Every day | ORAL | Status: DC

## 2013-06-15 NOTE — Patient Instructions (Signed)
F/U 4 months  Okay to use script  Continue current medications

## 2013-06-15 NOTE — Progress Notes (Signed)
Subjective:    Patient ID: Jonathan [name], male    DOB: [birth year], 52 y.o.   MRN: [id number]  HPI  Patient here for physical exam. He has no specific concerns. He was taken off of his nitrate and will like to try Viagra. He was prescribed injectable medication by urology when he was on the nitrate however he was unable to afford this therefore has not had any treatment for his erectile dysfunction. He was recently seen by his cardiologist note reviewed. This Crestor was increased to 20 mg. He had a repeat potassium level which was normal at 5.3. Medications and history reviewed   Review of Systems  GEN- denies fatigue, fever, weight loss,weakness, recent illness HEENT- denies eye drainage, change in vision, nasal discharge, CVS- denies chest pain, palpitations RESP- denies SOB, cough, wheeze ABD- denies N/V, change in stools, abd pain GU- denies dysuria, hematuria, dribbling, incontinence MSK- denies joint pain, muscle aches, injury Neuro- denies headache, dizziness, syncope, seizure activity      Objective:   Physical Exam  GEN- NAD, alert and oriented x3 HEENT- PERRL, EOMI, non injected sclera, pink conjunctiva, MMM, oropharynx clear, TM clear bilat Neck- Supple, no Bruit CVS- RRR, no murmur RESP-CTAB ABD-NABS,soft,NT,ND GU- Deferred- recent prostate exam- Urology EXT- No edema Pulses- Radial, DP- 2+ NEURO-CNII-XII in tact, no focal deficits      Assessment & Plan:   CPE- Immunizations UTD, preventative care UTD   Recent fasting labs

## 2013-06-15 NOTE — Assessment & Plan Note (Signed)
Well controlled 

## 2013-06-15 NOTE — Assessment & Plan Note (Signed)
Since he is now off the long acting nitrates he will wait 4 weeks then he can try the Viagra. Advised him to call the urologist back and let him know that he is no longer taking nitrates to see what other medications he can be tried on.

## 2013-08-26 ENCOUNTER — Telehealth: Payer: Self-pay | Admitting: Cardiovascular Disease

## 2013-08-26 NOTE — Telephone Encounter (Signed)
Having lab work next [employer] you please mail his order.

## 2013-08-26 NOTE — Telephone Encounter (Signed)
No answer --someone to call tomorrow 08/26/13

## 2013-08-30 ENCOUNTER — Other Ambulatory Visit: Payer: Self-pay | Admitting: *Deleted

## 2013-08-30 DIAGNOSIS — I119 Hypertensive heart disease without heart failure: Secondary | ICD-10-CM

## 2013-08-30 DIAGNOSIS — E782 Mixed hyperlipidemia: Secondary | ICD-10-CM

## 2013-08-30 NOTE — Telephone Encounter (Signed)
Left VM for patient to inform that lab slips will be mailed out today, Monday 08/30/13.

## 2013-09-03 ENCOUNTER — Other Ambulatory Visit: Payer: Self-pay | Admitting: *Deleted

## 2013-09-06 ENCOUNTER — Other Ambulatory Visit: Payer: Self-pay | Admitting: *Deleted

## 2013-09-06 DIAGNOSIS — I10 Essential (primary) hypertension: Secondary | ICD-10-CM

## 2013-09-07 ENCOUNTER — Other Ambulatory Visit: Payer: Self-pay | Admitting: *Deleted

## 2013-09-07 DIAGNOSIS — I10 Essential (primary) hypertension: Secondary | ICD-10-CM

## 2013-09-07 MED ORDER — LISINOPRIL 10 MG PO TABS: 10.0000 mg | ORAL_TABLET | Freq: Every day | ORAL | Status: DC

## 2013-09-07 NOTE — Telephone Encounter (Signed)
Rx was sent to pharmacy electronically. 

## 2013-09-14 ENCOUNTER — Ambulatory Visit (INDEPENDENT_AMBULATORY_CARE_PROVIDER_SITE_OTHER): Payer: 59 | Admitting: Urology

## 2013-09-14 DIAGNOSIS — N529 Male erectile dysfunction, unspecified: Secondary | ICD-10-CM

## 2013-09-21 LAB — LIPID PANEL
LDL Cholesterol: 94 mg/dL (ref 0–99)
Triglycerides: 52 mg/dL (ref <150)

## 2013-09-21 LAB — COMPREHENSIVE METABOLIC PANEL
ALT: 16 U/L (ref 0–53)
CO2: 29 mEq/L (ref 19–32)
Calcium: 9.3 mg/dL (ref 8.4–10.5)
Chloride: 103 mEq/L (ref 96–112)
Creat: 0.86 mg/dL (ref 0.50–1.35)
Total Protein: 6.5 g/dL (ref 6.0–8.3)

## 2013-09-23 ENCOUNTER — Other Ambulatory Visit: Payer: Self-pay

## 2013-10-12 ENCOUNTER — Telehealth: Payer: Self-pay | Admitting: *Deleted

## 2013-10-12 DIAGNOSIS — R7989 Other specified abnormal findings of blood chemistry: Secondary | ICD-10-CM

## 2013-10-12 NOTE — Telephone Encounter (Signed)
Per Dr. Tresa Endo hold lisinopril due to increased potassium. Avoid foods high in potassium such as bananas and orange juice. He can also google foods high in potassium. Recheck blood in 1 week. I will send lab slip to patient.

## 2013-10-20 ENCOUNTER — Telehealth: Payer: Self-pay | Admitting: Cardiovascular Disease

## 2013-10-20 NOTE — Telephone Encounter (Signed)
Left message on voicemail that labs(CMP) should be able to be release when the patient arrives at [facility] on 10/21/13

## 2013-10-20 NOTE — Telephone Encounter (Signed)
Pt said Dr Gurney Maxin wanted him to have some lab work.Please fax to [facility],he is going have it tomorrow.

## 2013-10-21 LAB — COMPREHENSIVE METABOLIC PANEL
ALT: 18 U/L (ref 0–53)
AST: 19 U/L (ref 0–37)
Alkaline Phosphatase: 55 U/L (ref 39–117)
CO2: 28 mEq/L (ref 19–32)
Sodium: 139 mEq/L (ref 135–145)
Total Bilirubin: 1 mg/dL (ref 0.3–1.2)
Total Protein: 6.5 g/dL (ref 6.0–8.3)

## 2014-01-03 ENCOUNTER — Ambulatory Visit (INDEPENDENT_AMBULATORY_CARE_PROVIDER_SITE_OTHER): Payer: 59 | Admitting: Family Medicine

## 2014-01-03 ENCOUNTER — Encounter: Payer: Self-pay | Admitting: Family Medicine

## 2014-01-03 VITALS — BP 128/78 | HR 80 | Temp 97.7°F | Resp 18 | Ht 69.5 in | Wt 198.0 lb

## 2014-01-03 DIAGNOSIS — E785 Hyperlipidemia, unspecified: Secondary | ICD-10-CM

## 2014-01-03 DIAGNOSIS — B351 Tinea unguium: Secondary | ICD-10-CM

## 2014-01-03 DIAGNOSIS — I2510 Atherosclerotic heart disease of native coronary artery without angina pectoris: Secondary | ICD-10-CM

## 2014-01-03 DIAGNOSIS — I10 Essential (primary) hypertension: Secondary | ICD-10-CM

## 2014-01-03 MED ORDER — EFINACONAZOLE 10 % EX SOLN: CUTANEOUS | Status: DC

## 2014-01-03 NOTE — Patient Instructions (Addendum)
I will look into the topical for your nail fungus and let you know Get fasting labs done within the next 2 weeks F/U  6 months for physical

## 2014-01-03 NOTE — Assessment & Plan Note (Signed)
Will try Jublia topical, if not start terbinafine 250mg  daily x 12 weeks

## 2014-01-03 NOTE — Assessment & Plan Note (Signed)
Blood pressure is well-controlled continue current medication 

## 2014-01-03 NOTE — Assessment & Plan Note (Signed)
Last lipid panel still looks pretty good. They would keep his LDL posterior to 70. He is currently on Crestor 20 mg we will recheck his lipid panel

## 2014-01-03 NOTE — Assessment & Plan Note (Signed)
Review cardiology note. Continue to keep risk factors under good control

## 2014-01-03 NOTE — Progress Notes (Signed)
Patient ID: Jonathan [name], male   DOB: 1961/10/27, 53 y.o.   MRN: [id number]   Subjective:    Patient ID: Jonathan [name], male    DOB: 1961/10/27, 53 y.o.   MRN: [id number]  Patient presents for 6 mos follow up  patient here to follow chronic medical problems. She has no specific concerns. He's not had any chest pain and is doing well cardiac-wise. I did review his last cardiology note. He was taken off the lisinopril due to recurrent hyperkalemia He is being followed by urology is currently on Cialis for erectile dysfunction doing well on this medication. He is no longer on any long acting nitrates  He has noticed return of nail fungus on great toe he would like to have treated   Review Of Systems:  GEN- denies fatigue, fever, weight loss,weakness, recent illness HEENT- denies eye drainage, change in vision, nasal discharge, CVS- denies chest pain, palpitations RESP- denies SOB, cough, wheeze ABD- denies N/V, change in stools, abd pain GU- denies dysuria, hematuria, dribbling, incontinence MSK- denies joint pain, muscle aches, injury Neuro- denies headache, dizziness, syncope, seizure activity       Objective:    BP 128/78  Pulse 80  Temp(Src) 97.7 F (36.5 C) (Oral)  Resp 18  Ht 5' 9.5" (1.765 m)  Wt 198 lb (89.812 kg)  BMI 28.83 kg/m2 GEN- NAD, alert and oriented x3 HEENT- PERRL, EOMI, non injected sclera, pink conjunctiva, MMM, oropharynx clear CVS- RRR, no murmur RESP-CTAB EXT- No edema Pulses- Radial, DP- 2+ Skin- Right great tonail- yellow color, thick nail        Assessment & Plan:      Problem List Items Addressed This Visit   None      Note: This dictation was prepared with Dragon dictation along with smaller phrase technology. Any transcriptional errors that result from this process are unintentional.

## 2014-02-28 ENCOUNTER — Other Ambulatory Visit: Payer: Self-pay | Admitting: Family Medicine

## 2014-02-28 LAB — LIPID PANEL
CHOL/HDL RATIO: 2.5 ratio
CHOLESTEROL: 155 mg/dL (ref 0–200)
HDL: 61 mg/dL (ref >39)
LDL Cholesterol: 83 mg/dL (ref 0–99)
TRIGLYCERIDES: 57 mg/dL (ref <150)
VLDL: 11 mg/dL (ref 0–40)

## 2014-02-28 LAB — CBC WITH DIFFERENTIAL/PLATELET
Basophils Absolute: 0.1 10*3/uL (ref 0.0–0.1)
Basophils Relative: 1 % (ref 0–1)
EOS PCT: 6 % — AB (ref 0–5)
Eosinophils Absolute: 0.3 10*3/uL (ref 0.0–0.7)
HCT: 44.2 % (ref 39.0–52.0)
HEMOGLOBIN: 15.3 g/dL (ref 13.0–17.0)
LYMPHS ABS: 2.1 10*3/uL (ref 0.7–4.0)
Lymphocytes Relative: 39 % (ref 12–46)
MCH: 30.4 pg (ref 26.0–34.0)
MCHC: 34.6 g/dL (ref 30.0–36.0)
MCV: 87.9 fL (ref 78.0–100.0)
MONOS PCT: 9 % (ref 3–12)
Monocytes Absolute: 0.5 10*3/uL (ref 0.1–1.0)
Neutro Abs: 2.4 10*3/uL (ref 1.7–7.7)
Neutrophils Relative %: 45 % (ref 43–77)
Platelets: 224 10*3/uL (ref 150–400)
RBC: 5.03 MIL/uL (ref 4.22–5.81)
RDW: 13 % (ref 11.5–15.5)
WBC: 5.3 10*3/uL (ref 4.0–10.5)

## 2014-02-28 LAB — COMPREHENSIVE METABOLIC PANEL
ALK PHOS: 54 U/L (ref 39–117)
ALT: 21 U/L (ref 0–53)
AST: 27 U/L (ref 0–37)
Albumin: 4.3 g/dL (ref 3.5–5.2)
BILIRUBIN TOTAL: 2.1 mg/dL — AB (ref 0.2–1.2)
BUN: 10 mg/dL (ref 6–23)
CO2: 27 meq/L (ref 19–32)
CREATININE: 0.71 mg/dL (ref 0.50–1.35)
Calcium: 9.4 mg/dL (ref 8.4–10.5)
Chloride: 104 mEq/L (ref 96–112)
GLUCOSE: 83 mg/dL (ref 70–99)
Potassium: 4.6 mEq/L (ref 3.5–5.3)
Sodium: 139 mEq/L (ref 135–145)
Total Protein: 6.2 g/dL (ref 6.0–8.3)

## 2014-03-07 ENCOUNTER — Ambulatory Visit: Payer: 59 | Admitting: Cardiovascular Disease

## 2014-03-09 ENCOUNTER — Encounter: Payer: Self-pay | Admitting: *Deleted

## 2014-05-18 ENCOUNTER — Encounter: Payer: Self-pay | Admitting: Cardiovascular Disease

## 2014-05-18 ENCOUNTER — Ambulatory Visit (INDEPENDENT_AMBULATORY_CARE_PROVIDER_SITE_OTHER): Payer: 59 | Admitting: Cardiovascular Disease

## 2014-05-18 VITALS — BP 150/92 | HR 62 | Ht 70.0 in | Wt 200.7 lb

## 2014-05-18 DIAGNOSIS — K219 Gastro-esophageal reflux disease without esophagitis: Secondary | ICD-10-CM

## 2014-05-18 DIAGNOSIS — I10 Essential (primary) hypertension: Secondary | ICD-10-CM

## 2014-05-18 DIAGNOSIS — E785 Hyperlipidemia, unspecified: Secondary | ICD-10-CM

## 2014-05-18 DIAGNOSIS — I2510 Atherosclerotic heart disease of native coronary artery without angina pectoris: Secondary | ICD-10-CM

## 2014-05-18 DIAGNOSIS — N529 Male erectile dysfunction, unspecified: Secondary | ICD-10-CM

## 2014-05-18 NOTE — Progress Notes (Signed)
Patient ID: Jonathan [name], male   DOB: 21-Jun-1961, 53 y.o.   MRN: [id number]      HPI: Jonathan [name] is a 53 y.o. male presents to the office for a one-year followup cardiology evaluation.  [name] suffered an acute coronary syndrome in November 2010 and was found to have totally occluded left circumflex marginal vessel at catheterization. A 2.75x18 mm vision stent was inserted by me with reestablishment of TIMI-3 flow, post dilated to 3 mm. He had a repeat catheterization in January 2012 and a stent was widely patent. He did have mild narrowing in the LAD. Additional problems include hyperlipidemia as well as hypertension. A nuclear perfusion study in August 2013 was unchanged from previously and showed a small area of mid to basal inferolateral scar. There was no ischemia. Post-rest ejection fraction was 67%.   [name] denies recent chest pain. He denies shortness of breath.  When I saw him one year ago, his LDL cholesterol had risen to 101 and at that time, further titration of his Crestor to to 20 mg was recommended.  Temperature is revealed potassium elevation at 5.5.  Subsequently, his lisinopril dose was discontinued.  Over the past year, he has been followed by Dr. Kevin Fenton for primary care.  He had repeat blood work done by her in April 2015.  His hemoglobin was 15.3, hematocrit 44.2.  Chemistry profile revealed potassium 4.7.  Renal function was excellent with a creatinine of 0.71 and a BUN of 10.  His most recent lipid studies showed a total cholesterol of 155, triglycerides 57, HDL 61, and his LDL had improved to 83.  Over the past year, [name], denies chest tightness, or shortness of breath.  He states his blood pressure home typically is in the 120 to 1:30 range systolic.  He has not been routinely exercising.  He presents for evaluation. Past Medical History  Diagnosis Date  . Coronary artery disease   . Simvastatin-induced rhabdomyolysis   .  Hyperlipidemia   . Substance abuse     > 20 years ago, marijauna, cocaine, hash  . Abnormal stress test 2011    SEHV-EF 59%, Moderate perfusion defect due to scar, mild periinfarct  . Echocardiogram 2011    mild posterior wall hypokinesis  . Peripheral vascular disease   . Myocardial infarction nov 2010    [name]  . Hypertension   . History of stress test 07/16/2012    abnormal myocardial perfusion study. No significant change from 08/03/2010 study, low risk scan.  [name] Hx of echocardiogram 12/14/2010    Past Surgical History  Procedure Laterality Date  . Coronary stent placement  10/06/2009    Successful percutaneous coronary intervention of the circumflex marginal-2 vessel with percutaneous transluminal coronary angioplasty, stenting with a 2.75 x 18 mm non-drug eluting stent Vision Stent post-dilated to 3.0 mm.  . Cardiac catheterization  12/14/2010    By Dr [name] showed a widely patent circumflex stent with mild plaque in his LAD.    Allergies  Allergen Reactions  . Other     apples  . Pantoprazole Other (See Comments)    More stomach problems    Current Outpatient Prescriptions  Medication Sig Dispense Refill  . Ascorbic Acid (VITAMIN C) 1000 MG tablet Take 1,000 mg by mouth 2 (two) times daily.        [name] aspirin EC 81 MG tablet Take 81 mg by mouth daily.        . B Complex-C (SUPER B  COMPLEX PO) Take 1 tablet by mouth daily.        . clopidogrel (PLAVIX) 75 MG tablet Take 1 tablet (75 mg total) by mouth daily.  90 tablet  3  . Coenzyme Q10 (CO Q 10) 100 MG CAPS Take 1 capsule by mouth daily.        [name] Dexlansoprazole 30 MG capsule Take 30 mg by mouth daily.      . Ginger, Zingiber officinalis, 550 MG CAPS Take 1 capsule by mouth daily.        . Magnesium 250 MG TABS Take 1 tablet by mouth daily.        . Multiple Vitamin (MULITIVITAMIN WITH MINERALS) TABS Take 1 tablet by mouth daily.        . nebivolol (BYSTOLIC) 5 MG tablet Take 1 tablet (5 mg total) by mouth daily.   90 tablet  3  . niacin (NIASPAN) 500 MG CR tablet Take 1 tablet (500 mg total) by mouth at bedtime.  90 tablet  3  . Omega-3 Fatty Acids (FISH OIL) 1200 MG CAPS Take 1 capsule by mouth daily.        . rosuvastatin (CRESTOR) 20 MG tablet Take 1 tablet (20 mg total) by mouth daily.  90 tablet  3  . Saw Palmetto, Serenoa repens, (SAW PALMETTO BERRIES PO) Take 1 tablet by mouth daily.        . tadalafil (CIALIS) 10 MG tablet Take 10 mg by mouth daily as needed for erectile dysfunction.      . vitamin E 400 UNIT capsule Take 400 Units by mouth daily.        . Zinc 50 MG TABS Take 1 tablet by mouth daily.         No current facility-administered medications for this visit.    Socially he is married for 29 years. He has 3 children 2 grandchildren. He completed 12th grade education. Works as a [employer]. He has a prior 30 year history of tobacco use but he quit in August 2008. He does drink occasional alcohol.  ROS General: Negative; No fevers, chills, or night sweats;  HEENT: Negative; No changes in vision or hearing, sinus congestion, difficulty swallowing Pulmonary: Negative; No cough, wheezing, shortness of breath, hemoptysis Cardiovascular: Negative; No chest pain, presyncope, syncope, palpatations GI: Positive for GERD; No nausea, vomiting, diarrhea, or abdominal pain GU: Positive for erectile dysfunction; No dysuria, hematuria, or difficulty voiding Musculoskeletal: Negative; no myalgias, joint pain, or weakness Hematologic/Oncology: Negative; no easy bruising, bleeding Endocrine: Negative; no heat/cold intolerance; no diabetes Neuro: Negative; no changes in balance, headaches Skin: Negative; No rashes or skin lesions Psychiatric: Negative; No behavioral problems, depression Sleep: Negative; No snoring, daytime sleepiness, hypersomnolence, bruxism, restless legs, hypnogognic hallucinations, no cataplexy Other comprehensive 14 point system review is negative.  PE BP 150/92  Pulse 62   Ht 5\' 10"  (1.778 m)  Wt 200 lb 11.2 oz (91.037 kg)  BMI 28.80 kg/m2  General: Alert, oriented, no distress.  Skin: normal turgor, no rashes HEENT: Normocephalic, atraumatic. Pupils round and reactive; sclera anicteric;no lid lag.  Nose without nasal septal hypertrophy Mouth/Parynx benign; Mallinpatti scale 2 Neck: No JVD, no carotid bruits with normal carotid upstroke Lungs: clear to ausculatation and percussion; no wheezing or rales Chest wall: Nontender to palpation.  He did have nipple rings bilaterally Heart: RRR, s1 s2 normal 1/ 6 systolic murmur.  No S3 gallop.  No diastolic murmur Abdomen: soft, nontender; no hepatosplenomehaly, BS+; abdominal aorta nontender and  not dilated by palpation. Back: No CVA tenderness Pulses 2+ Extremities: no clubbing cyanosis or edema, Homan's sign negative  Neurologic: grossly nonfocal Psychological: Normal affect and mood   ECG (independently read by me): Normal sinus rhythm at 62 beats per minute.  Intervals, normal.  Prior July 2014 ECG: Sinus rhythm 58 beats per minute. No significant ST changes.  LABS:  BMET    Component Value Date/Time   NA 139 02/28/2014 1150   K 4.6 02/28/2014 1150   CL 104 02/28/2014 1150   CO2 27 02/28/2014 1150   GLUCOSE 83 02/28/2014 1150   BUN 10 02/28/2014 1150   CREATININE 0.71 02/28/2014 1150   CREATININE 0.66 11/26/2011 1203   CALCIUM 9.4 02/28/2014 1150   GFRNONAA >90 11/26/2011 1203   GFRAA >90 11/26/2011 1203     Hepatic Function Panel     Component Value Date/Time   PROT 6.2 02/28/2014 1150   ALBUMIN 4.3 02/28/2014 1150   AST 27 02/28/2014 1150   ALT 21 02/28/2014 1150   ALKPHOS 54 02/28/2014 1150   BILITOT 2.1* 02/28/2014 1150   BILIDIR 0.3 03/15/2013 1200   IBILI 1.7* 03/15/2013 1200     CBC    Component Value Date/Time   WBC 5.3 02/28/2014 1150   RBC 5.03 02/28/2014 1150   HGB 15.3 02/28/2014 1150   HCT 44.2 02/28/2014 1150   PLT 224 02/28/2014 1150   MCV 87.9 02/28/2014 1150   MCH 30.4 02/28/2014  1150   MCHC 34.6 02/28/2014 1150   RDW 13.0 02/28/2014 1150   LYMPHSABS 2.1 02/28/2014 1150   MONOABS 0.5 02/28/2014 1150   EOSABS 0.3 02/28/2014 1150   BASOSABS 0.1 02/28/2014 1150     BNP No results found for this basename: probnp    Lipid Panel     Component Value Date/Time   CHOL 155 02/28/2014 1150   TRIG 57 02/28/2014 1150   HDL 61 02/28/2014 1150   CHOLHDL 2.5 02/28/2014 1150   VLDL 11 02/28/2014 1150   LDLCALC 83 02/28/2014 1150     RADIOLOGY: No results found.    ASSESSMENT AND PLAN: Mr. Cruces is 5 years status post his acute cardiac syndrome secondary to total circumflex marginal occlusion which was successfully intervened upon. At his last catheterization he had mild smooth in-stent narrowing of less than 30%.  When I saw 1 month ago, and further titrate his Crestor to 20 mg per his most recent lipid panel shows improvement with an LDL of 83.  In the past, had difficulty taking simvastatin, and I would not further titrate his Crestor.  If additional aggressive lipid lowering is necessary he may benefit from the addition of Zetia to get a target of less than 70.  His blood pressure today was mildly elevated at 150 systolically.  He tells me his blood pressure home typically is in the 120 to 130 range.  He will monitor this more closely.  If his blood pressure continues to be over 140 on aconsistent basis I will then recommend the addition of amlodipine to his medical regimen.  He sees Dr. Jeanice Lim for primary care.  There is a history of GERD, but this is well controlled. I discussed with him increase exercise and activity.  He is continuing to do well without bleeding on DAPTtherapy.  I'll see him in one year for cardiology reevaluation.   Lennette Bihari, MD, [facility]  05/18/2014 10:59 AM

## 2014-05-18 NOTE — Patient Instructions (Signed)
Your physician recommends that you schedule a follow-up appointment in: 1 year. No changes were made today in your therapy, however Dr. Tresa EndoKelly recommends that you monitor your blood pressure.    Blood Pressure Record Sheet Your blood pressure on this visit to the Emergency  Clinic is elevated. This does not necessarily mean you have hypertension, but it does mean that your blood pressure needs to be rechecked. Many times blood pressure increases because of illness, pain, anxiety, or other factors. We recommend that you get a series of blood pressure readings done over a period of about 5 days. It is best to get a reading in the morning and one in the evening. You should make sure you sit and relax for 1 to 5 minutes before the reading is taken. Write the readings down and make a follow-up appointment with your doctor to discuss the results. If there is not a free clinic or a drug store with blood pressure taking machines near you, you can purchase good blood pressure taking equipment from a drug store, often for less than the price of a physician's office call. Having one in the home also allows you the convenience of taking your blood pressure while you are home and in relaxed surroundings.  BLOOD PRESSURE LOG Date: _______________________  a.m. _____________________  p.m. _____________________ Date: _______________________  a.m. _____________________  p.m. _____________________ Date: _______________________  a.m. _____________________  p.m. _____________________ Date: _______________________  a.m. _____________________  p.m. _____________________ Date: _______________________  a.m. _____________________  p.m. _____________________ Document Released: 08/03/2003 Document Revised: 01/27/2012 Document Reviewed: 11/04/2005 Exit[employer] Patient Information 2015 [employer]. This information is not intended to replace advice given to you by your health care provider. Make sure you  discuss any questions you have with your health care provider.

## 2014-07-20 ENCOUNTER — Encounter: Payer: Self-pay | Admitting: Family Medicine

## 2014-07-20 ENCOUNTER — Ambulatory Visit (INDEPENDENT_AMBULATORY_CARE_PROVIDER_SITE_OTHER): Payer: 59 | Admitting: Family Medicine

## 2014-07-20 VITALS — BP 132/80 | HR 64 | Temp 98.1°F | Resp 12 | Ht 70.0 in | Wt 201.0 lb

## 2014-07-20 DIAGNOSIS — Z125 Encounter for screening for malignant neoplasm of prostate: Secondary | ICD-10-CM

## 2014-07-20 DIAGNOSIS — N529 Male erectile dysfunction, unspecified: Secondary | ICD-10-CM

## 2014-07-20 DIAGNOSIS — Z0000 Encounter for general adult medical examination without abnormal findings: Secondary | ICD-10-CM | POA: Insufficient documentation

## 2014-07-20 DIAGNOSIS — E785 Hyperlipidemia, unspecified: Secondary | ICD-10-CM

## 2014-07-20 DIAGNOSIS — Z23 Encounter for immunization: Secondary | ICD-10-CM

## 2014-07-20 DIAGNOSIS — I10 Essential (primary) hypertension: Secondary | ICD-10-CM

## 2014-07-20 MED ORDER — CLOPIDOGREL BISULFATE 75 MG PO TABS: 75.0000 mg | ORAL_TABLET | Freq: Every day | ORAL | Status: DC

## 2014-07-20 MED ORDER — NEBIVOLOL HCL 5 MG PO TABS: 5.0000 mg | ORAL_TABLET | Freq: Every day | ORAL | Status: DC

## 2014-07-20 MED ORDER — ROSUVASTATIN CALCIUM 20 MG PO TABS: 20.0000 mg | ORAL_TABLET | Freq: Every day | ORAL | Status: DC

## 2014-07-20 MED ORDER — NIACIN ER (ANTIHYPERLIPIDEMIC) 500 MG PO TBCR: 500.0000 mg | EXTENDED_RELEASE_TABLET | Freq: Every day | ORAL | Status: DC

## 2014-07-20 NOTE — Patient Instructions (Signed)
Continue current medications Keep watching blood pressures Flu shot given Get labs done fasting within the next 1-2 weeks F/u 6 months

## 2014-07-20 NOTE — Progress Notes (Signed)
Patient ID: Jonathan [name], male   DOB: 1961-11-05, 53 y.o.   MRN: [id number]   Subjective:    Patient ID: Jonathan [name], male    DOB: Aug 22, 1961, 53 y.o.   MRN: [id number]  Patient presents for CPE  patient here for complete physical exam. He has no specific concerns today. He is to monitor his blood pressure at home it ranges from 120 to 130s he did have one outlier of systolic of 140 his diastolics have been 70-80 her feet his last cardiology visit from July. He's due for repeat lipid panel. He's not had any chest pain or shortness of breath. He will like to have flu shot today. Medications were reviewed     Review Of Systems:  GEN- denies fatigue, fever, weight loss,weakness, recent illness HEENT- denies eye drainage, change in vision, nasal discharge, CVS- denies chest pain, palpitations RESP- denies SOB, cough, wheeze ABD- denies N/V, change in stools, abd pain GU- denies dysuria, hematuria, dribbling, incontinence MSK- denies joint pain, muscle aches, injury Neuro- denies headache, dizziness, syncope, seizure activity       Objective:    BP 132/80  Pulse 64  Temp(Src) 98.1 F (36.7 C) (Oral)  Resp 12  Ht  (1.778 m)  Wt 201 lb (91.173 kg)  BMI 28.84 kg/m2 GEN- NAD, alert and oriented x3 HEENT- PERRL, EOMI, non injected sclera, pink conjunctiva, MMM, oropharynx clear, TM clear bilat, no effusion Neck- Supple, no thyromegaly CVS- RRR, no murmur RESP-CTAB GU-declined ABD-NABS,soft,NT,ND EXT- No edema Pulses- Radial, DP- 2+        Assessment & Plan:      Problem List Items Addressed This Visit   Routine general medical examination at a health care facility - Primary   Relevant Orders      CBC with Differential      Comprehensive metabolic panel      Flu Vaccine QUAD 36+ mos PF IM (Fluarix Quad PF) (Completed)   Hyperlipidemia   Relevant Medications      niacin (NIASPAN) CR tablet      nebivolol (BYSTOLIC) tablet      rosuvastatin  (CRESTOR) tablet   Other Relevant Orders      Lipid panel      Flu Vaccine QUAD 36+ mos PF IM (Fluarix Quad PF) (Completed)   Essential hypertension, benign   Relevant Medications      niacin (NIASPAN) CR tablet      nebivolol (BYSTOLIC) tablet      rosuvastatin (CRESTOR) tablet   Other Relevant Orders      Flu Vaccine QUAD 36+ mos PF IM (Fluarix Quad PF) (Completed)   Erectile dysfunction   Relevant Orders      Flu Vaccine QUAD 36+ mos PF IM (Fluarix Quad PF) (Completed)    Other Visit Diagnoses   Prostate cancer screening        Relevant Orders       PSA       Flu Vaccine QUAD 36+ mos PF IM (Fluarix Quad PF) (Completed)    Need for prophylactic vaccination and inoculation against influenza        Relevant Orders       Flu Vaccine QUAD 36+ mos PF IM (Fluarix Quad PF) (Completed)       Note: This dictation was prepared with Dragon dictation along with smaller phrase technology. Any transcriptional errors that result from this process are unintentional.

## 2014-07-20 NOTE — Assessment & Plan Note (Signed)
CPE done. Colonoscopy up-to-date immunizations up-to-date he will get fasting labs done in a couple weeks. Regarding his erectile dysfunction I did give him a sample of Cialis 5 mg in the office he typically takes 10 mg as needed. PSA will also be done with his labs he understands the risk and benefits of this testing

## 2014-07-20 NOTE — Assessment & Plan Note (Signed)
His blood pressure looks good today his home readings have actually looks very well therefore we'll not start any new medications

## 2014-07-23 ENCOUNTER — Telehealth: Payer: Self-pay | Admitting: *Deleted

## 2014-07-23 NOTE — Telephone Encounter (Signed)
Received fax requesting PA for Crestor.   PA submitted.

## 2014-07-26 NOTE — Telephone Encounter (Signed)
Received PA determination.   PA approved.   07/26/2014- 07/26/2016.

## 2014-08-09 LAB — CBC WITH DIFFERENTIAL/PLATELET
BASOS PCT: 1 % (ref 0–1)
Basophils Absolute: 0.1 10*3/uL (ref 0.0–0.1)
EOS ABS: 0.2 10*3/uL (ref 0.0–0.7)
Eosinophils Relative: 4 % (ref 0–5)
HCT: 45.1 % (ref 39.0–52.0)
Hemoglobin: 15.7 g/dL (ref 13.0–17.0)
Lymphocytes Relative: 32 % (ref 12–46)
Lymphs Abs: 1.9 10*3/uL (ref 0.7–4.0)
MCH: 30.6 pg (ref 26.0–34.0)
MCHC: 34.8 g/dL (ref 30.0–36.0)
MCV: 87.9 fL (ref 78.0–100.0)
MONOS PCT: 13 % — AB (ref 3–12)
Monocytes Absolute: 0.8 10*3/uL (ref 0.1–1.0)
NEUTROS PCT: 50 % (ref 43–77)
Neutro Abs: 3 10*3/uL (ref 1.7–7.7)
Platelets: 257 10*3/uL (ref 150–400)
RBC: 5.13 MIL/uL (ref 4.22–5.81)
RDW: 13.5 % (ref 11.5–15.5)
WBC: 6 10*3/uL (ref 4.0–10.5)

## 2014-08-09 LAB — COMPREHENSIVE METABOLIC PANEL
ALT: 23 U/L (ref 0–53)
AST: 28 U/L (ref 0–37)
Albumin: 4.4 g/dL (ref 3.5–5.2)
Alkaline Phosphatase: 56 U/L (ref 39–117)
BILIRUBIN TOTAL: 2.2 mg/dL — AB (ref 0.2–1.2)
BUN: 10 mg/dL (ref 6–23)
CO2: 29 mEq/L (ref 19–32)
Calcium: 9.6 mg/dL (ref 8.4–10.5)
Chloride: 104 mEq/L (ref 96–112)
Creat: 0.8 mg/dL (ref 0.50–1.35)
Glucose, Bld: 86 mg/dL (ref 70–99)
Potassium: 4.8 mEq/L (ref 3.5–5.3)
Sodium: 140 mEq/L (ref 135–145)
Total Protein: 6.7 g/dL (ref 6.0–8.3)

## 2014-08-09 LAB — LIPID PANEL
CHOL/HDL RATIO: 2.7 ratio
Cholesterol: 165 mg/dL (ref 0–200)
HDL: 61 mg/dL (ref >39)
LDL CALC: 92 mg/dL (ref 0–99)
Triglycerides: 61 mg/dL (ref <150)
VLDL: 12 mg/dL (ref 0–40)

## 2014-08-09 LAB — PSA: PSA: 3.31 ng/mL (ref <=4.00)

## 2014-09-13 ENCOUNTER — Ambulatory Visit (INDEPENDENT_AMBULATORY_CARE_PROVIDER_SITE_OTHER): Payer: 59 | Admitting: Urology

## 2014-09-13 DIAGNOSIS — R972 Elevated prostate specific antigen [PSA]: Secondary | ICD-10-CM

## 2014-09-13 DIAGNOSIS — N5201 Erectile dysfunction due to arterial insufficiency: Secondary | ICD-10-CM

## 2014-09-27 ENCOUNTER — Other Ambulatory Visit: Payer: Self-pay | Admitting: *Deleted

## 2014-09-27 MED ORDER — ROSUVASTATIN CALCIUM 20 MG PO TABS: 20.0000 mg | ORAL_TABLET | Freq: Every day | ORAL | Status: DC

## 2014-09-27 NOTE — Telephone Encounter (Signed)
Received fax requesting refill on Plavix.  Refill appropriate and filled per protocol.  

## 2014-10-03 ENCOUNTER — Other Ambulatory Visit: Payer: Self-pay | Admitting: *Deleted

## 2014-10-03 MED ORDER — CLOPIDOGREL BISULFATE 75 MG PO TABS: 75.0000 mg | ORAL_TABLET | Freq: Every day | ORAL | Status: DC

## 2014-10-03 NOTE — Telephone Encounter (Signed)
Received fax requesting refill on Plavix.   Prescription sent to pharmacy.

## 2015-01-20 ENCOUNTER — Encounter: Payer: Self-pay | Admitting: Family Medicine

## 2015-01-20 ENCOUNTER — Ambulatory Visit (INDEPENDENT_AMBULATORY_CARE_PROVIDER_SITE_OTHER): Payer: 59 | Admitting: Family Medicine

## 2015-01-20 VITALS — BP 132/80 | HR 68 | Temp 98.5°F | Resp 14 | Ht 70.0 in | Wt 207.0 lb

## 2015-01-20 DIAGNOSIS — R972 Elevated prostate specific antigen [PSA]: Secondary | ICD-10-CM

## 2015-01-20 DIAGNOSIS — E785 Hyperlipidemia, unspecified: Secondary | ICD-10-CM

## 2015-01-20 DIAGNOSIS — I10 Essential (primary) hypertension: Secondary | ICD-10-CM | POA: Diagnosis not present

## 2015-01-20 DIAGNOSIS — I2510 Atherosclerotic heart disease of native coronary artery without angina pectoris: Secondary | ICD-10-CM

## 2015-01-20 NOTE — Patient Instructions (Signed)
Get the labs done fasting We will send results via mychart Work on the weight loss! F/U end of Sept for PHYSICAL

## 2015-01-20 NOTE — Progress Notes (Signed)
Patient ID: Jonathan DareStephen A [name], male   DOB: [birth year], 54 y.o.   MRN: [id number]   Subjective:    Patient ID: Jonathan DareStephen A [name], male    DOB: [birth year], 54 y.o.   MRN: [id number]  Patient presents for 6 month F/U  He has no concerns today, he has gained some weight, states he has not been watching diet as closely. No CP, no SOB. Due for repeat fasting labs for his cholesterol/CAD He also requested repeat PSA for urology, while his PSA was in normal range they felt it was on the high end for his age. Taking meds without any concerns    Review Of Systems:  GEN- denies fatigue, fever, weight loss,weakness, recent illness HEENT- denies eye drainage, change in vision, nasal discharge, CVS- denies chest pain, palpitations RESP- denies SOB, cough, wheeze ABD- denies N/V, change in stools, abd pain GU- denies dysuria, hematuria, dribbling, incontinence MSK- denies joint pain, muscle aches, injury Neuro- denies headache, dizziness, syncope, seizure activity       Objective:    BP 132/80 mmHg  Pulse 68  Temp(Src) 98.5 F (36.9 C) (Oral)  Resp 14  Ht 5\' 10"  (1.778 m)  Wt 207 lb (93.895 kg)  BMI 29.70 kg/m2 GEN- NAD, alert and oriented x3 HEENT- PERRL, EOMI, non injected sclera, pink conjunctiva, MMM, oropharynx clear Neck- Supple, no thyromegaly CVS- RRR, no murmur RESP-CTAB EXT- No edema Pulses- Radial 2+        Assessment & Plan:      Problem List Items Addressed This Visit      Unprioritized   Hyperlipidemia   Relevant Medications   sildenafil (VIAGRA) 50 MG tablet   Other Relevant Orders   Lipid panel   Essential hypertension, benign   Relevant Medications   sildenafil (VIAGRA) 50 MG tablet   CAD (coronary artery disease) - Primary   Relevant Medications   sildenafil (VIAGRA) 50 MG tablet   Other Relevant Orders   CBC with Differential/Platelet   Comprehensive metabolic panel   Lipid panel      Note: This dictation was prepared with Dragon  dictation along with smaller phrase technology. Any transcriptional errors that result from this process are unintentional.

## 2015-01-22 NOTE — Assessment & Plan Note (Signed)
Well controlled no change to meds 

## 2015-01-22 NOTE — Assessment & Plan Note (Signed)
Doing well from cardiac standpoint, discussed weight and healthy eating, he will make some changes to get weight back off

## 2015-01-31 LAB — CBC WITH DIFFERENTIAL/PLATELET
BASOS ABS: 0.1 10*3/uL (ref 0.0–0.1)
BASOS PCT: 1 % (ref 0–1)
EOS PCT: 4 % (ref 0–5)
Eosinophils Absolute: 0.2 10*3/uL (ref 0.0–0.7)
HCT: 47.4 % (ref 39.0–52.0)
Hemoglobin: 16.2 g/dL (ref 13.0–17.0)
LYMPHS ABS: 1.8 10*3/uL (ref 0.7–4.0)
LYMPHS PCT: 31 % (ref 12–46)
MCH: 31.5 pg (ref 26.0–34.0)
MCHC: 34.2 g/dL (ref 30.0–36.0)
MCV: 92.2 fL (ref 78.0–100.0)
MONO ABS: 0.7 10*3/uL (ref 0.1–1.0)
MONOS PCT: 12 % (ref 3–12)
MPV: 11.1 fL (ref 8.6–12.4)
Neutro Abs: 3 10*3/uL (ref 1.7–7.7)
Neutrophils Relative %: 52 % (ref 43–77)
PLATELETS: 259 10*3/uL (ref 150–400)
RBC: 5.14 MIL/uL (ref 4.22–5.81)
RDW: 13.2 % (ref 11.5–15.5)
WBC: 5.7 10*3/uL (ref 4.0–10.5)

## 2015-01-31 LAB — COMPREHENSIVE METABOLIC PANEL
ALK PHOS: 60 U/L (ref 39–117)
ALT: 33 U/L (ref 0–53)
AST: 32 U/L (ref 0–37)
Albumin: 4.4 g/dL (ref 3.5–5.2)
BUN: 15 mg/dL (ref 6–23)
CO2: 28 mEq/L (ref 19–32)
CREATININE: 0.82 mg/dL (ref 0.50–1.35)
Calcium: 9.6 mg/dL (ref 8.4–10.5)
Chloride: 102 mEq/L (ref 96–112)
Glucose, Bld: 75 mg/dL (ref 70–99)
Potassium: 5.4 mEq/L — ABNORMAL HIGH (ref 3.5–5.3)
Sodium: 140 mEq/L (ref 135–145)
Total Bilirubin: 2 mg/dL — ABNORMAL HIGH (ref 0.2–1.2)
Total Protein: 6.9 g/dL (ref 6.0–8.3)

## 2015-01-31 LAB — LIPID PANEL
CHOL/HDL RATIO: 3.3 ratio
Cholesterol: 216 mg/dL — ABNORMAL HIGH (ref 0–200)
HDL: 66 mg/dL (ref >=40)
LDL Cholesterol: 137 mg/dL — ABNORMAL HIGH (ref 0–99)
Triglycerides: 67 mg/dL (ref <150)
VLDL: 13 mg/dL (ref 0–40)

## 2015-01-31 LAB — PSA: PSA: 2.78 ng/mL (ref <=4.00)

## 2015-02-03 ENCOUNTER — Other Ambulatory Visit: Payer: Self-pay | Admitting: *Deleted

## 2015-02-03 MED ORDER — ROSUVASTATIN CALCIUM 40 MG PO TABS: 40.0000 mg | ORAL_TABLET | Freq: Every day | ORAL | Status: DC

## 2015-02-22 ENCOUNTER — Encounter: Payer: Self-pay | Admitting: Family Medicine

## 2015-02-22 ENCOUNTER — Ambulatory Visit (INDEPENDENT_AMBULATORY_CARE_PROVIDER_SITE_OTHER): Payer: 59 | Admitting: Family Medicine

## 2015-02-22 VITALS — BP 136/78 | HR 70 | Temp 98.0°F | Resp 14 | Ht 70.0 in | Wt 199.0 lb

## 2015-02-22 DIAGNOSIS — E785 Hyperlipidemia, unspecified: Secondary | ICD-10-CM

## 2015-02-22 NOTE — Assessment & Plan Note (Signed)
I recommend he continue with crestor as his cholesterol is going up and he has CAD, given copay card that will hopefully get his Crestor down to $3, if not will switch him to Lipitor 80mg  as generic I dont think going back to zocor will be beneficial and he evidently had some myalgias with this

## 2015-02-22 NOTE — Patient Instructions (Signed)
Continue crestor , try the copay card for cost   Repeat cholesterol in 12 weeks

## 2015-02-22 NOTE — Progress Notes (Signed)
Patient ID: Jonathan DareStephen A [name], male   DOB: 1961-04-25, 54 y.o.   MRN: [id number]   Subjective:    Patient ID: Jonathan DareStephen A [name], male    DOB: 1961-04-25, 54 y.o.   MRN: [id number]  Patient presents for Medication Reveiw   Pt here with to discuss cholesterol, I recently did fasting labs his TC went from  165 to 216, LDL went from 92-137, with his young CAD history, recommend he have strict cholesterol control, he has been on crestor 20mg , I recommended he increase to 40mg , however cost is of concern. He also felt like it may not have been working as when he was on zocor his cholesterol was good, though this was about 5 years ago. Evidently he was switched due to possible myalgias by his cardiologist from zocor to crestor and it has been titrated from 10mg  up to 40mg  now He wanted to go back on zocor as it was cheaper and his cholesterol was good No SE with crestor    Review Of Systems:  GEN- denies fatigue, fever, weight loss,weakness, recent illness HEENT- denies eye drainage, change in vision, nasal discharge, CVS- denies chest pain, palpitations RESP- denies SOB, cough, wheeze MSK- denies joint pain, muscle aches, injury Neuro- denies headache, dizziness, syncope, seizure activity       Objective:    BP 136/78 mmHg  Pulse 70  Temp(Src) 98 F (36.7 C) (Oral)  Resp 14  Ht 5\' 10"  (1.778 m)  Wt 199 lb (90.266 kg)  BMI 28.55 kg/m2 GEN- NAD, alert and oriented x3       Assessment & Plan:      Problem List Items Addressed This Visit      Unprioritized   Hyperlipidemia - Primary    I recommend he continue with crestor as his cholesterol is going up and he has CAD, given copay card that will hopefully get his Crestor down to $3, if not will switch him to Lipitor 80mg  as generic I dont think going back to zocor will be beneficial and he evidently had some myalgias with this         Note: This dictation was prepared with Dragon dictation along with smaller phrase  technology. Any transcriptional errors that result from this process are unintentional.

## 2015-03-14 ENCOUNTER — Telehealth (HOSPITAL_COMMUNITY): Payer: Self-pay | Admitting: *Deleted

## 2015-03-20 ENCOUNTER — Telehealth (HOSPITAL_COMMUNITY): Payer: Self-pay | Admitting: *Deleted

## 2015-03-20 NOTE — Telephone Encounter (Signed)
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 PLEASE LET ME KNOW IF THIS IS OK AND ENTER ORDER IN EPIC.   THANKS

## 2015-03-24 ENCOUNTER — Other Ambulatory Visit: Payer: Self-pay | Admitting: *Deleted

## 2015-03-24 DIAGNOSIS — I25810 Atherosclerosis of coronary artery bypass graft(s) without angina pectoris: Secondary | ICD-10-CM

## 2015-03-24 DIAGNOSIS — I10 Essential (primary) hypertension: Secondary | ICD-10-CM

## 2015-03-24 NOTE — Telephone Encounter (Signed)
The order has been placed for his stress test.

## 2015-04-25 ENCOUNTER — Other Ambulatory Visit: Payer: Self-pay | Admitting: *Deleted

## 2015-04-25 MED ORDER — ROSUVASTATIN CALCIUM 40 MG PO TABS: 40.0000 mg | ORAL_TABLET | Freq: Every day | ORAL | Status: DC

## 2015-04-25 NOTE — Telephone Encounter (Signed)
Received fax requesting refill on Crestor with 90 day supply.   Refill appropriate and filled per protocol. 

## 2015-05-04 ENCOUNTER — Telehealth (HOSPITAL_COMMUNITY): Payer: Self-pay

## 2015-05-04 NOTE — Telephone Encounter (Signed)
Encounter complete. 

## 2015-05-09 ENCOUNTER — Ambulatory Visit (HOSPITAL_COMMUNITY)
Admission: RE | Admit: 2015-05-09 | Discharge: 2015-05-09 | Disposition: A | Discharge status: Home / Self Care | Payer: 59 | Source: Ambulatory Visit | Attending: Cardiovascular Disease | Admitting: Cardiovascular Disease

## 2015-05-09 ENCOUNTER — Encounter (HOSPITAL_COMMUNITY): Payer: Self-pay

## 2015-05-09 DIAGNOSIS — I25810 Atherosclerosis of coronary artery bypass graft(s) without angina pectoris: Secondary | ICD-10-CM

## 2015-05-09 DIAGNOSIS — I10 Essential (primary) hypertension: Secondary | ICD-10-CM | POA: Diagnosis not present

## 2015-05-09 LAB — MYOCARDIAL PERFUSION IMAGING
CHL CUP MPHR: 164 {beats}/min
CHL CUP NUCLEAR SRS: 5
CSEPED: 12 min
CSEPEW: 13.4 METS
LV sys vol: 50 mL
LVDIAVOL: 118 mL
NUC STRESS TID: 1.04
Peak HR: 157 {beats}/min
Percent HR: 95 %
RPE: 16
Rest HR: 68 {beats}/min
SDS: 4
SSS: 9

## 2015-05-09 MED ORDER — TECHNETIUM TC 99M SESTAMIBI GENERIC - CARDIOLITE
29.8000 | Freq: Once | INTRAVENOUS | Status: AC | PRN
  Administered 2015-05-09: 29.8 via INTRAVENOUS

## 2015-05-09 MED ORDER — TECHNETIUM TC 99M SESTAMIBI GENERIC - CARDIOLITE
10.5000 | Freq: Once | INTRAVENOUS | Status: AC | PRN
  Administered 2015-05-09: 11 via INTRAVENOUS

## 2015-05-09 MED ORDER — TECHNETIUM TC 99M SESTAMIBI GENERIC - CARDIOLITE: 10.9000 | Freq: Once | INTRAVENOUS | Status: DC | PRN

## 2015-05-09 MED ORDER — TECHNETIUM TC 99M SESTAMIBI GENERIC - CARDIOLITE: 30.5000 | Freq: Once | INTRAVENOUS | Status: DC | PRN

## 2015-05-09 MED ORDER — REGADENOSON 0.4 MG/5ML IV SOLN: 0.4000 mg | Freq: Once | INTRAVENOUS | Status: DC

## 2015-05-09 MED ORDER — AMINOPHYLLINE 25 MG/ML IV SOLN: 75.0000 mg | Freq: Once | INTRAVENOUS | Status: DC

## 2015-05-16 ENCOUNTER — Other Ambulatory Visit: Payer: Self-pay | Admitting: *Deleted

## 2015-05-16 DIAGNOSIS — E785 Hyperlipidemia, unspecified: Secondary | ICD-10-CM

## 2015-05-24 ENCOUNTER — Other Ambulatory Visit: Payer: 59

## 2015-05-24 DIAGNOSIS — E785 Hyperlipidemia, unspecified: Secondary | ICD-10-CM

## 2015-05-25 LAB — CBC WITH DIFFERENTIAL/PLATELET
BASOS ABS: 0.1 10*3/uL (ref 0.0–0.1)
Basophils Relative: 1 % (ref 0–1)
Eosinophils Absolute: 0.3 10*3/uL (ref 0.0–0.7)
Eosinophils Relative: 5 % (ref 0–5)
HEMATOCRIT: 43.1 % (ref 39.0–52.0)
Hemoglobin: 14.9 g/dL (ref 13.0–17.0)
LYMPHS PCT: 30 % (ref 12–46)
Lymphs Abs: 1.9 10*3/uL (ref 0.7–4.0)
MCH: 30.8 pg (ref 26.0–34.0)
MCHC: 34.6 g/dL (ref 30.0–36.0)
MCV: 89.2 fL (ref 78.0–100.0)
MONO ABS: 0.5 10*3/uL (ref 0.1–1.0)
MONOS PCT: 8 % (ref 3–12)
MPV: 10.5 fL (ref 8.6–12.4)
Neutro Abs: 3.5 10*3/uL (ref 1.7–7.7)
Neutrophils Relative %: 56 % (ref 43–77)
PLATELETS: 243 10*3/uL (ref 150–400)
RBC: 4.83 MIL/uL (ref 4.22–5.81)
RDW: 13.2 % (ref 11.5–15.5)
WBC: 6.3 10*3/uL (ref 4.0–10.5)

## 2015-05-25 LAB — COMPLETE METABOLIC PANEL WITH GFR
ALT: 18 U/L (ref 0–53)
AST: 19 U/L (ref 0–37)
Albumin: 4.1 g/dL (ref 3.5–5.2)
Alkaline Phosphatase: 55 U/L (ref 39–117)
BILIRUBIN TOTAL: 0.8 mg/dL (ref 0.2–1.2)
BUN: 10 mg/dL (ref 6–23)
CO2: 26 mEq/L (ref 19–32)
CREATININE: 0.73 mg/dL (ref 0.50–1.35)
Calcium: 9.2 mg/dL (ref 8.4–10.5)
Chloride: 105 mEq/L (ref 96–112)
GFR, Est African American: >89 mL/min
GFR, Est Non African American: >89 mL/min
Glucose, Bld: 86 mg/dL (ref 70–99)
Potassium: 5.1 mEq/L (ref 3.5–5.3)
SODIUM: 140 meq/L (ref 135–145)
Total Protein: 6.2 g/dL (ref 6.0–8.3)

## 2015-05-25 LAB — LIPID PANEL
CHOLESTEROL: 164 mg/dL (ref 0–200)
HDL: 59 mg/dL (ref >=40)
LDL Cholesterol: 65 mg/dL (ref 0–99)
Total CHOL/HDL Ratio: 2.8 Ratio
Triglycerides: 201 mg/dL — ABNORMAL HIGH (ref <150)
VLDL: 40 mg/dL (ref 0–40)

## 2015-05-29 ENCOUNTER — Ambulatory Visit (INDEPENDENT_AMBULATORY_CARE_PROVIDER_SITE_OTHER): Payer: 59 | Admitting: Cardiovascular Disease

## 2015-05-29 VITALS — BP 124/82 | HR 69 | Ht 70.0 in | Wt 194.8 lb

## 2015-05-29 DIAGNOSIS — I2510 Atherosclerotic heart disease of native coronary artery without angina pectoris: Secondary | ICD-10-CM | POA: Diagnosis not present

## 2015-05-29 DIAGNOSIS — I10 Essential (primary) hypertension: Secondary | ICD-10-CM

## 2015-05-29 DIAGNOSIS — E785 Hyperlipidemia, unspecified: Secondary | ICD-10-CM | POA: Diagnosis not present

## 2015-05-29 DIAGNOSIS — I2583 Coronary atherosclerosis due to lipid rich plaque: Secondary | ICD-10-CM

## 2015-05-29 DIAGNOSIS — N529 Male erectile dysfunction, unspecified: Secondary | ICD-10-CM | POA: Diagnosis not present

## 2015-05-29 NOTE — Patient Instructions (Signed)
Your physician recommends that you schedule a follow-up appointment in: one year with Dr. Kelly  

## 2015-05-30 ENCOUNTER — Encounter: Payer: Self-pay | Admitting: Family Medicine

## 2015-05-31 ENCOUNTER — Encounter: Payer: Self-pay | Admitting: Cardiovascular Disease

## 2015-05-31 NOTE — Progress Notes (Signed)
Patient ID: Jonathan [name], male   DOB: October 07, 1961, 54 y.o.   MRN: [id number]      HPI: Jonathan [name] is a 54 y.o. male presents to the office for a one-year followup cardiology evaluation.  Jonathan [name] suffered an acute coronary syndrome in November 2010 and was found to have totally occluded left circumflex marginal vessel at catheterization. A 2.75x18 mm vision stent was inserted by me with reestablishment of TIMI-3 flow, post dilated to 3 mm. He had a repeat catheterization in January 2012 and a stent was widely patent. He did have mild narrowing in the LAD. Additional problems include hyperlipidemia as well as hypertension. A nuclear perfusion study in August 2013 was unchanged from previously and showed a small area of mid to basal inferolateral scar. There was no ischemia. Post-rest ejection fraction was 67%.   Jonathan [name] denies recent chest pain. He denies shortness of breath.  When I saw him one year ago, his LDL cholesterol had risen to 101 and at that time, further titration of his Crestor to to 20 mg was recommended.  He had developed mild hyperkalemia on ACE inhibitor vision and his lisinopril dose was ultimately discontinued. Last year, laboratory by his primary physician Dr. Kevin Fenton  revealed hemoglobin 15.3, hematocrit 44.2.  Chemistry profile revealed potassium 4.7.  Renal function was excellent with a creatinine of 0.71 and a BUN of 10.  His most recent lipid studies showed a total cholesterol of 155, triglycerides 57, HDL 61, and his LDL had improved to 83.   since I last saw him, he underwent a nuclear perfusion study on 05/11/2015. This remained low risk and only showed a very small region of scar the inferolateral wall at the base.  Normal function with an ejection fraction of 57%.  There was no ischemia.  Over the past year.  He denies recurrent chest pain.  He sees Dr. Jeanice Lim, for primary care now who will be rechecking laboratory.  He presents for  evaluation.   Past Medical History  Diagnosis Date  . Coronary artery disease   . Simvastatin-induced rhabdomyolysis   . Hyperlipidemia   . Substance abuse     > 20 years ago, marijauna, cocaine, hash  . Abnormal stress test 2011    SEHV-EF 59%, Moderate perfusion defect due to scar, mild periinfarct  . Echocardiogram 2011    mild posterior wall hypokinesis  . Peripheral vascular disease   . Myocardial infarction nov 2010    [name]  . Hypertension   . History of stress test 07/16/2012    abnormal myocardial perfusion study. No significant change from 08/03/2010 study, low risk scan.  [name] Hx of echocardiogram 12/14/2010    Past Surgical History  Procedure Laterality Date  . Coronary stent placement  10/06/2009    Successful percutaneous coronary intervention of the circumflex marginal-2 vessel with percutaneous transluminal coronary angioplasty, stenting with a 2.75 x 18 mm non-drug eluting stent Vision Stent post-dilated to 3.0 mm.  . Cardiac catheterization  12/14/2010    By Dr [name] showed a widely patent circumflex stent with mild plaque in his LAD.    Allergies  Allergen Reactions  . Other     apples  . Pantoprazole Other (See Comments)    More stomach problems    Current Outpatient Prescriptions  Medication Sig Dispense Refill  . Ascorbic Acid (VITAMIN C) 1000 MG tablet Take 1,000 mg by mouth 2 (two) times daily.      [name] aspirin EC 81 MG  tablet Take 81 mg by mouth daily.      . B Complex-C (SUPER B COMPLEX PO) Take 1 tablet by mouth daily.      . clopidogrel (PLAVIX) 75 MG tablet Take 1 tablet (75 mg total) by mouth daily. 90 tablet 3  . Coenzyme Q10 (CO Q 10) 100 MG CAPS Take 1 capsule by mouth daily.      . Ginger, Zingiber officinalis, 550 MG CAPS Take 1 capsule by mouth daily.      . Magnesium 250 MG TABS Take 1 tablet by mouth daily.      . Multiple Vitamin (MULITIVITAMIN WITH MINERALS) TABS Take 1 tablet by mouth daily.      . nebivolol (BYSTOLIC) 5 MG  tablet Take 1 tablet (5 mg total) by mouth daily. 90 tablet 3  . niacin (NIASPAN) 500 MG CR tablet Take 1 tablet (500 mg total) by mouth at bedtime. 90 tablet 3  . Omega-3 Fatty Acids (FISH OIL) 1200 MG CAPS Take 1 capsule by mouth daily.      . rosuvastatin (CRESTOR) 40 MG tablet Take 1 tablet (40 mg total) by mouth daily. 90 tablet 1  . Saw Palmetto, Serenoa repens, (SAW PALMETTO BERRIES PO) Take 1 tablet by mouth daily.      . sildenafil (VIAGRA) 50 MG tablet Take 50-100 mg by mouth daily as needed for erectile dysfunction.    . vitamin E 400 UNIT capsule Take 400 Units by mouth daily.      . Zinc 50 MG TABS Take 1 tablet by mouth daily.       No current facility-administered medications for this visit.    Socially he is married for 30 years. He has 3 children 2 grandchildren. He completed 12th grade education. Works as a [employer]. He has a prior 30 year history of tobacco use but he quit in August 2008. He does drink occasional alcohol.  ROS General: Negative; No fevers, chills, or night sweats;  HEENT: Negative; No changes in vision or hearing, sinus congestion, difficulty swallowing Pulmonary: Negative; No cough, wheezing, shortness of breath, hemoptysis Cardiovascular: Negative; No chest pain, presyncope, syncope, palpatations GI: Positive for GERD; No nausea, vomiting, diarrhea, or abdominal pain GU: Positive for erectile dysfunction; No dysuria, hematuria, or difficulty voiding Musculoskeletal: Negative; no myalgias, joint pain, or weakness Hematologic/Oncology: Negative; no easy bruising, bleeding Endocrine: Negative; no heat/cold intolerance; no diabetes Neuro: Negative; no changes in balance, headaches Skin: Negative; No rashes or skin lesions Psychiatric: Negative; No behavioral problems, depression Sleep: Negative; No snoring, daytime sleepiness, hypersomnolence, bruxism, restless legs, hypnogognic hallucinations, no cataplexy Other comprehensive 14 point system review  is negative.  PE BP 124/82 mmHg  Pulse 69  Ht 5\' 10"  (1.778 m)  Wt 194 lb 12.8 oz (88.361 kg)  BMI 27.95 kg/m2   Wt Readings from Last 3 Encounters:  05/29/15 194 lb 12.8 oz (88.361 kg)  05/09/15 199 lb (90.266 kg)  02/22/15 199 lb (90.266 kg)   General: Alert, oriented, no distress.  Skin: normal turgor, no rashes HEENT: Normocephalic, atraumatic. Pupils round and reactive; sclera anicteric;no lid lag.  Nose without nasal septal hypertrophy Mouth/Parynx benign; Mallinpatti scale 2 Neck: No JVD, no carotid bruits with normal carotid upstroke Lungs: clear to ausculatation and percussion; no wheezing or rales Chest wall: Nontender to palpation.  He did have nipple rings bilaterally Heart: RRR, s1 s2 normal 1/ 6 systolic murmur.  No S3 gallop.  No diastolic murmur Abdomen: soft, nontender; no hepatosplenomehaly, BS+; abdominal  aorta nontender and not dilated by palpation. Back: No CVA tenderness Pulses 2+ Extremities: no clubbing cyanosis or edema, Homan's sign negative  Neurologic: grossly nonfocal Psychological: Normal affect and mood  ECG (independently read by me):  Normal sinus rhythm at 69 bpm.  No ectopy.  Normal intervals.  No ECG evidence for prior infarction.   ECG (independently read by me): Normal sinus rhythm at 62 beats per minute.  Intervals, normal.  Prior July 2014 ECG: Sinus rhythm 58 beats per minute. No significant ST changes.  LABS:  BMP Latest Ref Rng 05/24/2015 01/30/2015 08/08/2014  Glucose 70 - 99 mg/dL 86 75 86  BUN 6 - 23 mg/dL 10 15 10   Creatinine 0.50 - 1.35 mg/dL 5.46 2.70 3.50  Sodium 135 - 145 mEq/L 140 140 140  Potassium 3.5 - 5.3 mEq/L 5.1 5.4(H) 4.8  Chloride 96 - 112 mEq/L 105 102 104  CO2 19 - 32 mEq/L 26 28 29   Calcium 8.4 - 10.5 mg/dL 9.2 9.6 9.6   Hepatic Function Latest Ref Rng 05/24/2015 01/30/2015 08/08/2014  Total Protein 6.0 - 8.3 g/dL 6.2 6.9 6.7  Albumin 3.5 - 5.2 g/dL 4.1 4.4 4.4  AST 0 - 37 U/L 19 32 28  ALT 0 - 53 U/L 18 33  23  Alk Phosphatase 39 - 117 U/L 55 60 56  Total Bilirubin 0.2 - 1.2 mg/dL 0.8 2.0(H) 2.2(H)  Bilirubin, Direct 0.0 - 0.3 mg/dL - - -   CBC Latest Ref Rng 05/24/2015 01/30/2015 08/08/2014  WBC 4.0 - 10.5 K/uL 6.3 5.7 6.0  Hemoglobin 13.0 - 17.0 g/dL 09.3 81.8 29.9  Hematocrit 39.0 - 52.0 % 43.1 47.4 45.1  Platelets 150 - 400 K/uL 243 259 257   Lab Results  Component Value Date   MCV 89.2 05/24/2015   MCV 92.2 01/30/2015   MCV 87.9 08/08/2014   Lab Results  Component Value Date   TSH 1.776 02/03/2012   Lab Results  Component Value Date   HGBA1C  12/14/2010    5.3 (NOTE)                                                                       According to the ADA Clinical Practice Recommendations for 2011, when HbA1c is used as a screening test:   >=6.5%   Diagnostic of Diabetes Mellitus           (if abnormal result  is confirmed)  5.7-6.4%   Increased risk of developing Diabetes Mellitus  References:Diagnosis and Classification of Diabetes Mellitus,Diabetes Care,2011,34(Suppl 1):S62-S69 and Standards of Medical Care in         Diabetes - 2011,Diabetes Care,2011,34  (Suppl 1):S11-S61.   Lipid Panel     Component Value Date/Time   CHOL 164 05/24/2015 1529   TRIG 201* 05/24/2015 1529   HDL 59 05/24/2015 1529   CHOLHDL 2.8 05/24/2015 1529   VLDL 40 05/24/2015 1529   LDLCALC 65 05/24/2015 1529    ASSESSMENT AND PLAN: Jonathan [name] is a 54 year old male  Who is 6 years status post his acute cardiac syndrome secondary to total circumflex marginal occlusion which was successfully intervened upon. At his last catheterization he had mild smooth in-stent narrowing of less than 30%.   Over the  past year, he is remained stable without recurrent symptomatology.  I reviewed his recent nuclear perfusion study this is unchanged and shows only a very small region of basal inferolateral scar without ischemia and remains low risk. His blood pressure today is controlled.  He continues to be on dual  antiplatelets therapy with aspirin and Plavix and denies bleeding.  His heart rate is 69 bpm and he is tolerating Bystolic 5 mg.  He has mixed hyperlipidemia and remains on Crestor in addition to niacin and fish oil.  His most recent lipid panel was excellent with an LDL of 65  Although triglycerides remain elevated at 201. I have suggested further titration of fish oil. He's not requiring nitroglycerin use.  He takes Viagra on an as-needed basis for erectile dysfunction..  As long as he remains stable I will see him in one year for reevaluation or sooner if problems arise.  Lennette Bihari, MD, [facility]  05/31/2015 2:15 PM

## 2015-08-03 ENCOUNTER — Encounter: Payer: Self-pay | Admitting: Family Medicine

## 2015-09-10 ENCOUNTER — Other Ambulatory Visit: Payer: Self-pay | Admitting: Family Medicine

## 2015-09-11 NOTE — Telephone Encounter (Signed)
Refill appropriate and filled per protocol. 

## 2015-09-12 ENCOUNTER — Ambulatory Visit (INDEPENDENT_AMBULATORY_CARE_PROVIDER_SITE_OTHER): Payer: 59 | Admitting: Urology

## 2015-09-12 DIAGNOSIS — N401 Enlarged prostate with lower urinary tract symptoms: Secondary | ICD-10-CM

## 2015-09-12 DIAGNOSIS — R972 Elevated prostate specific antigen [PSA]: Secondary | ICD-10-CM

## 2015-09-12 DIAGNOSIS — N5201 Erectile dysfunction due to arterial insufficiency: Secondary | ICD-10-CM | POA: Diagnosis not present

## 2015-10-16 ENCOUNTER — Encounter: Payer: Self-pay | Admitting: *Deleted

## 2015-10-16 ENCOUNTER — Other Ambulatory Visit: Payer: Self-pay | Admitting: Family Medicine

## 2015-10-16 NOTE — Telephone Encounter (Signed)
Medication filled x1 with no refills.   Requires office visit before any further refills can be given.   Letter sent.  

## 2015-11-03 ENCOUNTER — Ambulatory Visit (INDEPENDENT_AMBULATORY_CARE_PROVIDER_SITE_OTHER): Payer: 59 | Admitting: Family Medicine

## 2015-11-03 ENCOUNTER — Encounter: Payer: Self-pay | Admitting: Family Medicine

## 2015-11-03 VITALS — BP 128/74 | HR 76 | Temp 98.3°F | Resp 12 | Ht 70.0 in | Wt 200.0 lb

## 2015-11-03 DIAGNOSIS — E785 Hyperlipidemia, unspecified: Secondary | ICD-10-CM

## 2015-11-03 DIAGNOSIS — I10 Essential (primary) hypertension: Secondary | ICD-10-CM

## 2015-11-03 DIAGNOSIS — Z0000 Encounter for general adult medical examination without abnormal findings: Secondary | ICD-10-CM | POA: Diagnosis not present

## 2015-11-03 DIAGNOSIS — I2583 Coronary atherosclerosis due to lipid rich plaque: Secondary | ICD-10-CM

## 2015-11-03 DIAGNOSIS — I2510 Atherosclerotic heart disease of native coronary artery without angina pectoris: Secondary | ICD-10-CM | POA: Diagnosis not present

## 2015-11-03 LAB — CBC WITH DIFFERENTIAL/PLATELET
Basophils Absolute: 0 10*3/uL (ref 0.0–0.1)
Basophils Relative: 0 % (ref 0–1)
EOS ABS: 0.3 10*3/uL (ref 0.0–0.7)
EOS PCT: 5 % (ref 0–5)
HCT: 45.2 % (ref 39.0–52.0)
Hemoglobin: 15.5 g/dL (ref 13.0–17.0)
Lymphocytes Relative: 24 % (ref 12–46)
Lymphs Abs: 1.6 10*3/uL (ref 0.7–4.0)
MCH: 30.8 pg (ref 26.0–34.0)
MCHC: 34.3 g/dL (ref 30.0–36.0)
MCV: 89.7 fL (ref 78.0–100.0)
MONOS PCT: 16 % — AB (ref 3–12)
MPV: 10.8 fL (ref 8.6–12.4)
Monocytes Absolute: 1 10*3/uL (ref 0.1–1.0)
NEUTROS PCT: 55 % (ref 43–77)
Neutro Abs: 3.6 10*3/uL (ref 1.7–7.7)
PLATELETS: 245 10*3/uL (ref 150–400)
RBC: 5.04 MIL/uL (ref 4.22–5.81)
RDW: 12.8 % (ref 11.5–15.5)
WBC: 6.5 10*3/uL (ref 4.0–10.5)

## 2015-11-03 LAB — LIPID PANEL
CHOL/HDL RATIO: 3 ratio (ref <=5.0)
Cholesterol: 166 mg/dL (ref 125–200)
HDL: 55 mg/dL (ref >=40)
LDL Cholesterol: 96 mg/dL (ref <130)
Triglycerides: 75 mg/dL (ref <150)
VLDL: 15 mg/dL (ref <30)

## 2015-11-03 LAB — COMPREHENSIVE METABOLIC PANEL
ALT: 22 U/L (ref 9–46)
AST: 19 U/L (ref 10–35)
Albumin: 4.1 g/dL (ref 3.6–5.1)
Alkaline Phosphatase: 52 U/L (ref 40–115)
BUN: 11 mg/dL (ref 7–25)
CHLORIDE: 102 mmol/L (ref 98–110)
CO2: 26 mmol/L (ref 20–31)
CREATININE: 0.79 mg/dL (ref 0.70–1.33)
Calcium: 8.8 mg/dL (ref 8.6–10.3)
GLUCOSE: 77 mg/dL (ref 70–99)
POTASSIUM: 4.7 mmol/L (ref 3.5–5.3)
SODIUM: 137 mmol/L (ref 135–146)
TOTAL PROTEIN: 6.7 g/dL (ref 6.1–8.1)
Total Bilirubin: 0.9 mg/dL (ref 0.2–1.2)

## 2015-11-03 MED ORDER — NIACIN ER (ANTIHYPERLIPIDEMIC) 500 MG PO TBCR: 500.0000 mg | EXTENDED_RELEASE_TABLET | Freq: Every day | ORAL | Status: DC

## 2015-11-03 MED ORDER — ROSUVASTATIN CALCIUM 40 MG PO TABS: ORAL_TABLET | ORAL | Status: DC

## 2015-11-03 MED ORDER — NEBIVOLOL HCL 5 MG PO TABS: 5.0000 mg | ORAL_TABLET | Freq: Every day | ORAL | Status: DC

## 2015-11-03 MED ORDER — CLOPIDOGREL BISULFATE 75 MG PO TABS: 75.0000 mg | ORAL_TABLET | Freq: Every day | ORAL | Status: DC

## 2015-11-03 NOTE — Assessment & Plan Note (Signed)
CPE done, immunizations UTD, fasting labs today Blood pressure well controlled, goal LDL < 100 with CAD No change to medications Obtain last urology note Colonoscopy due in 2017

## 2015-11-03 NOTE — Progress Notes (Signed)
Patient ID: Jonathan [name], male   DOB: 1961/10/13, 54 y.o.   MRN: [id number]   Subjective:    Patient ID: Jonathan [name], male    DOB: 1961/10/13, 54 y.o.   MRN: [id number]  Patient presents for CPE  Pt here for CPE and fasting labs. He broke his right foot 1 week ago, followed by Orthopedics in [place], currently in walking boot. Using TYlenol for pain. No new concerns today, has had sinus drainage, mild cough, low grade fever earlier this week, now improved. Taking meds as prescribed   Colonoscopy UTD  PSA screening UTD  Immunizations UTD      Review Of Systems:  GEN- denies fatigue, fever, weight loss,weakness, recent illness HEENT- denies eye drainage, change in vision, nasal discharge, CVS- denies chest pain, palpitations RESP- denies SOB, cough, wheeze ABD- denies N/V, change in stools, abd pain GU- denies dysuria, hematuria, dribbling, incontinence MSK-+joint pain, muscle aches, injury Neuro- denies headache, dizziness, syncope, seizure activity       Objective:    BP 128/74 mmHg  Pulse 76  Temp(Src) 98.3 F (36.8 C) (Oral)  Resp 12  Ht 5\' 10"  (1.778 m)  Wt 200 lb (90.719 kg)  BMI 28.70 kg/m2 GEN- NAD, alert and oriented x3 HEENT- PERRL, EOMI, non injected sclera, pink conjunctiva, MMM, oropharynx clear, nares clear rhinorrhea, TM clear bilat no effusion Neck- Supple, no thyromegaly, no bruit  CVS- RRR, no murmur RESP-CTAB ABD-NABS,soft,NT,ND GU- Declines -per Urology EXT- No edema, Right foot in boot Pulses- Radial, DP- 2+        Assessment & Plan:      Problem List Items Addressed This Visit    Routine general medical examination at a health care facility - Primary    CPE done, immunizations UTD, fasting labs today Blood pressure well controlled, goal LDL < 100 with CAD No change to medications Obtain last urology note Colonoscopy due in 2017      Relevant Orders   CBC with Differential/Platelet   Comprehensive metabolic panel   Hyperlipidemia   Relevant Medications   nebivolol (BYSTOLIC) 5 MG tablet   rosuvastatin (CRESTOR) 40 MG tablet   niacin (NIASPAN) 500 MG CR tablet   Other Relevant Orders   Lipid panel   Essential hypertension, benign   Relevant Medications   nebivolol (BYSTOLIC) 5 MG tablet   rosuvastatin (CRESTOR) 40 MG tablet   niacin (NIASPAN) 500 MG CR tablet   CAD (coronary artery disease)   Relevant Medications   nebivolol (BYSTOLIC) 5 MG tablet   rosuvastatin (CRESTOR) 40 MG tablet   niacin (NIASPAN) 500 MG CR tablet   Other Relevant Orders   Lipid panel      Note: This dictation was prepared with Dragon dictation along with smaller phrase technology. Any transcriptional errors that result from this process are unintentional.

## 2015-11-03 NOTE — Patient Instructions (Signed)
We will call with lab results  No changes to medications  F/U 6 months

## 2015-11-14 ENCOUNTER — Telehealth: Payer: Self-pay | Admitting: *Deleted

## 2015-11-14 MED ORDER — ROSUVASTATIN CALCIUM 40 MG PO TABS: ORAL_TABLET | ORAL | Status: DC

## 2015-11-14 NOTE — Telephone Encounter (Signed)
Received letter from [facility].   Advised that Crestor will no longer be covered; instead generic rouvastatin will be covered.   MD made aware and approved interchange to generic.   Prescription sent to pharmacy.

## 2016-04-30 ENCOUNTER — Ambulatory Visit (INDEPENDENT_AMBULATORY_CARE_PROVIDER_SITE_OTHER): Payer: 59 | Admitting: Cardiovascular Disease

## 2016-04-30 ENCOUNTER — Encounter: Payer: Self-pay | Admitting: Cardiovascular Disease

## 2016-04-30 VITALS — BP 126/80 | HR 64 | Ht 70.0 in | Wt 207.5 lb

## 2016-04-30 DIAGNOSIS — I10 Essential (primary) hypertension: Secondary | ICD-10-CM | POA: Diagnosis not present

## 2016-04-30 DIAGNOSIS — I2510 Atherosclerotic heart disease of native coronary artery without angina pectoris: Secondary | ICD-10-CM

## 2016-04-30 DIAGNOSIS — I2583 Coronary atherosclerosis due to lipid rich plaque: Principal | ICD-10-CM

## 2016-04-30 NOTE — Patient Instructions (Signed)
Your physician has requested that you have en exercise stress myoview. THIS WILL BE DONE IN JUNE OF 2018.  Your physician wants you to follow-up in: 1 year or sooner if needed. You will receive a reminder letter in the mail two months in advance. If you don't receive a letter, please call our office to schedule the follow-up appointment.   If you need a refill on your cardiac medications before your next appointment, please call your pharmacy.

## 2016-05-01 ENCOUNTER — Telehealth: Payer: Self-pay | Admitting: *Deleted

## 2016-05-01 NOTE — Telephone Encounter (Signed)
[facility] Dr Bosie ClosSchooler.

## 2016-05-02 ENCOUNTER — Encounter: Payer: Self-pay | Admitting: Cardiovascular Disease

## 2016-05-02 NOTE — Progress Notes (Signed)
Patient ID: MADSEN FROHN, male   DOB: 30-Mar-1961, 55 y.o.   MRN: [id number]     Primary MD: Dr. Milinda Antis  HPI: OCTAVIANO BUTTREY is a 55 y.o. male presents to the office for a one-year followup cardiology evaluation.  Mr. Rentfro suffered an acute coronary syndrome in November 2010 and was found to have totally occluded left circumflex marginal vessel at catheterization. A 2.75x18 mm vision stent was inserted by me with reestablishment of TIMI-3 flow, post dilated to 3 mm. He had a repeat catheterization in January 2012 and a stent was widely patent. He did have mild narrowing in the LAD. Additional problems include hyperlipidemia as well as hypertension. A nuclear perfusion study in August 2013 was unchanged from previously and showed a small area of mid to basal inferolateral scar. There was no ischemia. Post-rest ejection fraction was 67%.   Mr. Moczygemba denies recent chest pain. He denies shortness of breath.  When I saw him one year ago, his LDL cholesterol had risen to 101 and at that time, further titration of his Crestor to to 20 mg was recommended.  He had developed mild hyperkalemia on ACE inhibitor vision and his lisinopril dose was ultimately discontinued. Last year, laboratory by his primary physician Dr. Kevin Fenton  revealed hemoglobin 15.3, hematocrit 44.2.  Chemistry profile revealed potassium 4.7.  Renal function was excellent with a creatinine of 0.71 and a BUN of 10.  His most recent lipid studies showed a total cholesterol of 155, triglycerides 57, HDL 61, and his LDL had improved to 83.  He underwent a nuclear perfusion study on 05/11/2015. This remained low risk and only showed a very small region of scar the inferolateral wall at the base.  Normal function with an ejection fraction of 57%.  There was no ischemia.  Over the past year.  He denies recurrent chest pain.  He remains active.  He denies exertional dyspnea.  He will be undergoing a colonoscopy in July.   He sees Dr. Jeanice Lim, for primary care  who checked laboratory in 2016.  He presents for evaluation.   Past Medical History  Diagnosis Date  . Coronary artery disease   . Simvastatin-induced rhabdomyolysis   . Hyperlipidemia   . Substance abuse     > 20 years ago, marijauna, cocaine, hash  . Abnormal stress test 2011    SEHV-EF 59%, Moderate perfusion defect due to scar, mild periinfarct  . Echocardiogram 2011    mild posterior wall hypokinesis  . Peripheral vascular disease (HCC)   . Myocardial infarction [facility]) nov 2010    [name]  . Hypertension   . History of stress test 07/16/2012    abnormal myocardial perfusion study. No significant change from 08/03/2010 study, low risk scan.  [name] Hx of echocardiogram 12/14/2010    Past Surgical History  Procedure Laterality Date  . Coronary stent placement  10/06/2009    Successful percutaneous coronary intervention of the circumflex marginal-2 vessel with percutaneous transluminal coronary angioplasty, stenting with a 2.75 x 18 mm non-drug eluting stent Vision Stent post-dilated to 3.0 mm.  . Cardiac catheterization  12/14/2010    By Dr [name] showed a widely patent circumflex stent with mild plaque in his LAD.    Allergies  Allergen Reactions  . Other     apples  . Pantoprazole Other (See Comments)    More stomach problems    Current Outpatient Prescriptions  Medication Sig Dispense Refill  . Ascorbic Acid (VITAMIN C) 1000 MG tablet  Take 1,000 mg by mouth 2 (two) times daily.      [name] aspirin EC 81 MG tablet Take 81 mg by mouth daily.      . B Complex-C (SUPER B COMPLEX PO) Take 1 tablet by mouth daily.      . clopidogrel (PLAVIX) 75 MG tablet Take 1 tablet (75 mg total) by mouth daily. 90 tablet 3  . Coenzyme Q10 (CO Q 10) 100 MG CAPS Take 1 capsule by mouth daily.      . Ginger, Zingiber officinalis, 550 MG CAPS Take 1 capsule by mouth daily.      . Magnesium 250 MG TABS Take 1 tablet by mouth daily.      . Multiple Vitamin  (MULITIVITAMIN WITH MINERALS) TABS Take 1 tablet by mouth daily.      . nebivolol (BYSTOLIC) 5 MG tablet Take 1 tablet (5 mg total) by mouth daily. 90 tablet 3  . niacin (NIASPAN) 500 MG CR tablet Take 1 tablet (500 mg total) by mouth at bedtime. 90 tablet 3  . Omega-3 Fatty Acids (FISH OIL) 1200 MG CAPS Take 1 capsule by mouth daily.      . rosuvastatin (CRESTOR) 40 MG tablet TAKE 1 TABLET (40 MG TOTAL) BY MOUTH DAILY. 90 tablet 3  . Saw Palmetto, Serenoa repens, (SAW PALMETTO BERRIES PO) Take 1 tablet by mouth daily.      . sildenafil (VIAGRA) 50 MG tablet Take 50-100 mg by mouth daily as needed for erectile dysfunction.    . vitamin E 400 UNIT capsule Take 400 Units by mouth daily.      . Zinc 50 MG TABS Take 1 tablet by mouth daily.       No current facility-administered medications for this visit.    Socially he is married for 31 years. He has 3 children 2 grandchildren. He completed 12th grade education. Works as a Naval architect. He has a prior 30 year history of tobacco use but he quit in August 2008. He does drink occasional alcohol.  ROS General: Negative; No fevers, chills, or night sweats;  HEENT: Negative; No changes in vision or hearing, sinus congestion, difficulty swallowing Pulmonary: Negative; No cough, wheezing, shortness of breath, hemoptysis Cardiovascular: Negative; No chest pain, presyncope, syncope, palpatations GI: Positive for GERD; No nausea, vomiting, diarrhea, or abdominal pain GU: Positive for erectile dysfunction; No dysuria, hematuria, or difficulty voiding Musculoskeletal: Negative; no myalgias, joint pain, or weakness Hematologic/Oncology: Negative; no easy bruising, bleeding Endocrine: Negative; no heat/cold intolerance; no diabetes Neuro: Negative; no changes in balance, headaches Skin: Negative; No rashes or skin lesions Psychiatric: Negative; No behavioral problems, depression Sleep: Negative; No snoring, daytime sleepiness, hypersomnolence, bruxism,  restless legs, hypnogognic hallucinations, no cataplexy Other comprehensive 14 point system review is negative.  PE BP 126/80 mmHg  Pulse 64  Ht 5\' 10"  (1.778 m)  Wt 207 lb 8 oz (94.121 kg)  BMI 29.77 kg/m2   Repeat BP 134/84  Wt Readings from Last 3 Encounters:  04/30/16 207 lb 8 oz (94.121 kg)  11/03/15 200 lb (90.719 kg)  05/29/15 194 lb 12.8 oz (88.361 kg)   General: Alert, oriented, no distress.  Skin: normal turgor, no rashes HEENT: Normocephalic, atraumatic. Pupils round and reactive; sclera anicteric;no lid lag.  Nose without nasal septal hypertrophy Mouth/Parynx benign; Mallinpatti scale 2 Neck: No JVD, no carotid bruits with normal carotid upstroke Lungs: clear to ausculatation and percussion; no wheezing or rales Chest wall: Nontender to palpation.  He did have nipple rings  bilaterally Heart: RRR, s1 s2 normal 1/ 6 systolic murmur.  No S3 gallop.  No diastolic murmur Abdomen: soft, nontender; no hepatosplenomehaly, BS+; abdominal aorta nontender and not dilated by palpation. Back: No CVA tenderness Pulses 2+ Extremities: no clubbing cyanosis or edema, Homan's sign negative  Neurologic: grossly nonfocal Psychological: Normal affect and mood  ECG (independently read by me): Normal sinus rhythm at 65 bpm.  Normal intervals.  No ST segment changes.  July 2016 ECG (independently read by me):  Normal sinus rhythm at 69 bpm.  No ectopy.  Normal intervals.  No ECG evidence for prior infarction.   July 2015 ECG (independently read by me): Normal sinus rhythm at 62 beats per minute.  Intervals, normal.  Prior July 2014 ECG: Sinus rhythm 58 beats per minute. No significant ST changes.  LABS:  BMP Latest Ref Rng 11/03/2015 05/24/2015 01/30/2015  Glucose 70 - 99 mg/dL 77 86 75  BUN 7 - 25 mg/dL 11 10 15   Creatinine 0.70 - 1.33 mg/dL 6.64 4.03 4.74  Sodium 135 - 146 mmol/L 137 140 140  Potassium 3.5 - 5.3 mmol/L 4.7 5.1 5.4(H)  Chloride 98 - 110 mmol/L 102 105 102  CO2 20  - 31 mmol/L 26 26 28   Calcium 8.6 - 10.3 mg/dL 8.8 9.2 9.6   Hepatic Function Latest Ref Rng 11/03/2015 05/24/2015 01/30/2015  Total Protein 6.1 - 8.1 g/dL 6.7 6.2 6.9  Albumin 3.6 - 5.1 g/dL 4.1 4.1 4.4  AST 10 - 35 U/L 19 19 32  ALT 9 - 46 U/L 22 18 33  Alk Phosphatase 40 - 115 U/L 52 55 60  Total Bilirubin 0.2 - 1.2 mg/dL 0.9 0.8 2.0(H)   CBC Latest Ref Rng 11/03/2015 05/24/2015 01/30/2015  WBC 4.0 - 10.5 K/uL 6.5 6.3 5.7  Hemoglobin 13.0 - 17.0 g/dL 25.9 56.3 87.5  Hematocrit 39.0 - 52.0 % 45.2 43.1 47.4  Platelets 150 - 400 K/uL 245 243 259   Lab Results  Component Value Date   MCV 89.7 11/03/2015   MCV 89.2 05/24/2015   MCV 92.2 01/30/2015   Lab Results  Component Value Date   TSH 1.776 02/03/2012   Lab Results  Component Value Date   HGBA1C  12/14/2010    5.3 (NOTE)                                                                       According to the ADA Clinical Practice Recommendations for 2011, when HbA1c is used as a screening test:   >=6.5%   Diagnostic of Diabetes Mellitus           (if abnormal result  is confirmed)  5.7-6.4%   Increased risk of developing Diabetes Mellitus  References:Diagnosis and Classification of Diabetes Mellitus,Diabetes Care,2011,34(Suppl 1):S62-S69 and Standards of Medical Care in         Diabetes - 2011,Diabetes Care,2011,34  (Suppl 1):S11-S61.   Lipid Panel     Component Value Date/Time   CHOL 166 11/03/2015 0911   TRIG 75 11/03/2015 0911   HDL 55 11/03/2015 0911   CHOLHDL 3.0 11/03/2015 0911   VLDL 15 11/03/2015 0911   LDLCALC 96 11/03/2015 0911    ASSESSMENT AND PLAN: Mr. Bonenfant is a 55 year old male who  is status post his acute cardiac syndrome secondary to total circumflex marginal occlusion which was successfully intervened upon in November 2010.   At his last catheterization he had mild smooth in-stent narrowing of less than 30%.  His last  nuclear perfusion study in 04/2015  is unchanged and shows only a very small region of  basal inferolateral scar without ischemia and remains low risk. His blood pressure today is controlled.  He continues to be on dual antiplatelets therapy with aspirin and Plavix and denies bleeding.  His heart rate is 65 bpm and he is tolerating Bystolic 5 mg.  He has mixed hyperlipidemia and remains on Crestor in addition to niacin and fish oil.  Laboratory in December 2016 showed a total cholesterol 166.  LDL was not at target and was still at 96.  His triglycerides have significantly improved from his previous evaluation of 201.  I have recommended follow-up fasting laboratory.  He will be undergoing a colonoscopy in July.  He will hold aspirin and Plavix for 5 days prior to this procedure , and he was given clearance to undergo this evaluation.  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  Since his last study was last year, I will schedule him for a 2 year follow-up exercise nuclear stress test in 2018, and will see him in the office in follow-up for further evaluation. Lennette Bihari, MD, [facility]  05/02/2016 2:27 PM

## 2016-06-03 LAB — HM COLONOSCOPY

## 2016-12-10 ENCOUNTER — Other Ambulatory Visit: Payer: Self-pay | Admitting: Family Medicine

## 2016-12-17 ENCOUNTER — Other Ambulatory Visit: Payer: Self-pay | Admitting: Family Medicine

## 2017-02-12 ENCOUNTER — Ambulatory Visit (INDEPENDENT_AMBULATORY_CARE_PROVIDER_SITE_OTHER): Payer: 59 | Admitting: Family Medicine

## 2017-02-12 ENCOUNTER — Telehealth: Payer: Self-pay | Admitting: *Deleted

## 2017-02-12 ENCOUNTER — Encounter: Payer: Self-pay | Admitting: Family Medicine

## 2017-02-12 VITALS — BP 124/66 | HR 66 | Temp 98.9°F | Resp 14 | Ht 70.0 in | Wt 214.0 lb

## 2017-02-12 DIAGNOSIS — N529 Male erectile dysfunction, unspecified: Secondary | ICD-10-CM

## 2017-02-12 DIAGNOSIS — I2510 Atherosclerotic heart disease of native coronary artery without angina pectoris: Secondary | ICD-10-CM

## 2017-02-12 DIAGNOSIS — I10 Essential (primary) hypertension: Secondary | ICD-10-CM | POA: Diagnosis not present

## 2017-02-12 DIAGNOSIS — Z125 Encounter for screening for malignant neoplasm of prostate: Secondary | ICD-10-CM

## 2017-02-12 DIAGNOSIS — L600 Ingrowing nail: Secondary | ICD-10-CM | POA: Diagnosis not present

## 2017-02-12 DIAGNOSIS — I2583 Coronary atherosclerosis due to lipid rich plaque: Secondary | ICD-10-CM

## 2017-02-12 DIAGNOSIS — Z0000 Encounter for general adult medical examination without abnormal findings: Secondary | ICD-10-CM | POA: Diagnosis not present

## 2017-02-12 MED ORDER — CEPHALEXIN 500 MG PO CAPS: 500.0000 mg | ORAL_CAPSULE | Freq: Two times a day (BID) | ORAL | 0 refills | Status: DC

## 2017-02-12 MED ORDER — NEBIVOLOL HCL 5 MG PO TABS: 5.0000 mg | ORAL_TABLET | Freq: Every day | ORAL | 3 refills | Status: DC

## 2017-02-12 MED ORDER — HYDROCODONE-ACETAMINOPHEN 5-325 MG PO TABS: 1.0000 | ORAL_TABLET | Freq: Four times a day (QID) | ORAL | 0 refills | Status: DC | PRN

## 2017-02-12 MED ORDER — CLOTRIMAZOLE-BETAMETHASONE 1-0.05 % EX CREA: 1.0000 "application " | TOPICAL_CREAM | Freq: Two times a day (BID) | CUTANEOUS | 0 refills | Status: DC

## 2017-02-12 NOTE — Telephone Encounter (Signed)
lotrisone sent

## 2017-02-12 NOTE — Patient Instructions (Addendum)
We will call with lab results  Take antibiotics as prescribed Soak foot, pat dry  Pain medication as needed  F/U 6 MONTHS    Fingernail or Toenail Removal, Adult, Care After This sheet gives you information about how to care for yourself after your procedure. Your health care provider may also give you more specific instructions. If you have problems or questions, contact your health care provider. What can I expect after the procedure? After the procedure, it is common to have:  Pain.  Redness.  Swelling.  Soreness. Follow these instructions at home:  If you have a splint:  Do not put pressure on any part of the splint until it is fully hardened. This may take several hours.  Wear the splint as told by your health care provider. Remove it only as told by your health care provider.  Loosen the splint if your fingers or toes tingle, become numb, or turn cold and blue.  Keep the splint clean.  If the splint is not waterproof:  Do not let it get wet.  Cover it with a watertight covering when you take a bath or a shower. Wound care    Follow instructions from your health care provider about how to take care of your wound. Make sure you:  Wash your hands with soap and water before you change your bandage (dressing). If soap and water are not available, use hand sanitizer.  Change your dressing as told by your health care provider.  Keep your dressing dry until your health care provider says it can be removed.  Leave stitches (sutures), skin glue, or adhesive strips in place. These skin closures may need to stay in place for 2 weeks or longer. If adhesive strip edges start to loosen and curl up, you may trim the loose edges. Do not remove adhesive strips completely unless your health care provider tells you to do that.  Check your wound every day for signs of infection. Check for:  More redness, swelling, or pain.  More fluid or blood.  Warmth.  Pus or a bad  smell. Managing pain, stiffness, and swelling   Move your fingers or toes often to avoid stiffness and to lessen swelling.  Raise (elevate) the injured area above the level of your heart while you are sitting or lying down. You may need to keep your finger or toe raised or supported on a pillow for 24 hours or as told by your health care provider.  Soak your hand or foot in warm, soapy water for 10-20 minutes, 3 times a day or as told by your health care provider. Medicine   Take over-the-counter and prescription medicines only as told by your health care provider.  If you were prescribed an antibiotic medicine, use it as told by your health care provider. Do not stop using the antibiotic even if your condition improves. General instructions   If you were given a shoe to wear, wear it as told by your health care provider.  Keep all follow-up visits as told by your health care provider. This is important. Contact a health care provider if:  You have more redness, swelling, or pain around your wound.  You have more fluid or blood coming from your wound.  Your wound feels warm to the touch.  You have pus or a bad smell coming from your wound.  You have a fever.  Your finger or toe looks blue or black. This information is not intended to replace advice given  to you by your health care provider. Make sure you discuss any questions you have with your health care provider. Document Released: 11/25/2014 Document Revised: 07/03/2016 Document Reviewed: 05/13/2016 Elsevier Interactive Patient Education  2017 [employer].

## 2017-02-12 NOTE — Telephone Encounter (Signed)
Received call from patient.   Reports that he went to pharmacy to pick up prescription for cream for skin condition, but there was no cream prescribed.   MD please advise.

## 2017-02-12 NOTE — Addendum Note (Signed)
Addended by: Milinda AntisURHAM, Kym Scannell F on: 02/12/2017 01:07 PM   Modules accepted: Orders

## 2017-02-12 NOTE — Progress Notes (Addendum)
Subjective:    Patient ID: Jonathan [name], male    DOB: 10-Jun-1961, 56 y.o.   MRN: [id number]  Patient presents for CPE (is fasting) and R Great Toe Pain (inner side of great toe having issues- states that it has been growing out fungus infected toe and now has some pain)   Pt here for CPE , concerned about infection in his right great toe, was treated toenail fungus over past year, now toenail ingrown. , no drainage out of toe , but painful with walking   UTD- last 2017 colonoscopy due to polyps- [facility]- Dr. Bosie Clos, -repeat in 5 years   TDAP- UTD    Due for fasting labs Followed by cardiology for history of CAD and hypertension Follows with Urology- last seen 2016 -  for Erectile dysfunction and mild BPH- last PSA from 2016 was 2.78   Due for PSA and request tesosterone uses viagra as needed  Medications reviewed/history reviewed  ADDENDUM-Skin- rash on inner legs for past month, itchy, no new spots - he had small amount of erythematous scaley circular lesions scattered various size on bilat thighs -- will treat for possible tinea corporis, given lotrisone due to itching     Review Of Systems:  GEN- denies fatigue, fever, weight loss,weakness, recent illness HEENT- denies eye drainage, change in vision, nasal discharge, CVS- denies chest pain, palpitations RESP- denies SOB, cough, wheeze ABD- denies N/V, change in stools, abd pain GU- denies dysuria, hematuria, dribbling, incontinence MSK- denies joint pain, muscle aches, injury Neuro- denies headache, dizziness, syncope, seizure activity       Objective:    BP 124/66   Pulse 66   Temp 98.9 F (37.2 C) (Oral)   Resp 14   Ht 5\' 10"  (1.778 m)   Wt 214 lb (97.1 kg)   SpO2 99%   BMI 30.71 kg/m  GEN- NAD, alert and oriented x3 HEENT- PERRL, EOMI, non injected sclera, pink conjunctiva, MMM, oropharynx clear Neck- Supple, no thyromegaly CVS- RRR, no murmur RESP-CTAB ABD-NABS,soft,NT,ND EXT- No edema  SKIN-  RIGHT FOOT- MEDIAL great toe nail ingrown with swelling and redness of skin , no drainage  Pulses- Radial, DP- 2+   Procedure- Partial Toenail Removal  Procedure explained to patient questions answered benefits and risks discussed verbal consent obtained. Antiseptic-betadine  Anesthesia-lidocaine 1% NO EPI Digital block performed on Great toe and turniquot placed. Once pt numb, elevator used to to medial side of nail/ingrown side, partial (1/3) of  nail removed  Minimal blood loss, vaseline gauze applied and pressure bandage with Coban Patient tolerated procedure well Bandage applied       Assessment & Plan:      Problem List Items Addressed This Visit    Routine general medical examination at a health care facility - Primary    CPE done, immunizations UTD, obtain last colonoscopy report from [facility] Fasting labs today and testosterone, PSA        Relevant Orders   CBC with Differential/Platelet   Comprehensive metabolic panel   Essential hypertension, benign    Controlled no changes to meds       Relevant Medications   nebivolol (BYSTOLIC) 5 MG tablet   Other Relevant Orders   Lipid panel   TSH   Erectile dysfunction    Check tesosterone, though he has CAD so would need to clear with cardiology about use of replacement if needed      Relevant Orders   Testosterone   CAD (coronary artery  disease)   Relevant Medications   nebivolol (BYSTOLIC) 5 MG tablet   Other Relevant Orders   Lipid panel    Other Visit Diagnoses    Screening PSA (prostate specific antigen)       Relevant Orders   PSA   Ingrown nail       Partial nail removal, infected ingrown nail, given keflex and instructions for after care       Note: This dictation was prepared with Dragon dictation along with smaller phrase technology. Any transcriptional errors that result from this process are unintentional.

## 2017-02-12 NOTE — Assessment & Plan Note (Signed)
Controlled no changes to meds 

## 2017-02-12 NOTE — Assessment & Plan Note (Signed)
Check tesosterone, though he has CAD so would need to clear with cardiology about use of replacement if needed

## 2017-02-12 NOTE — Assessment & Plan Note (Signed)
CPE done, immunizations UTD, [facility] Fasting labs today and testosterone, PSA

## 2017-02-13 LAB — COMPREHENSIVE METABOLIC PANEL
ALT: 27 U/L (ref 9–46)
AST: 25 U/L (ref 10–35)
Albumin: 4.2 g/dL (ref 3.6–5.1)
Alkaline Phosphatase: 53 U/L (ref 40–115)
BILIRUBIN TOTAL: 0.9 mg/dL (ref 0.2–1.2)
BUN: 9 mg/dL (ref 7–25)
CHLORIDE: 106 mmol/L (ref 98–110)
CO2: 25 mmol/L (ref 20–31)
CREATININE: 0.77 mg/dL (ref 0.70–1.33)
Calcium: 9.2 mg/dL (ref 8.6–10.3)
Glucose, Bld: 90 mg/dL (ref 70–99)
Potassium: 4.9 mmol/L (ref 3.5–5.3)
SODIUM: 141 mmol/L (ref 135–146)
Total Protein: 6.6 g/dL (ref 6.1–8.1)

## 2017-02-13 LAB — CBC WITH DIFFERENTIAL/PLATELET
Basophils Absolute: 0 cells/uL (ref 0–200)
Basophils Relative: 0 %
Eosinophils Absolute: 165 cells/uL (ref 15–500)
Eosinophils Relative: 3 %
HCT: 48.2 % (ref 38.5–50.0)
HEMOGLOBIN: 15.9 g/dL (ref 13.0–17.0)
LYMPHS ABS: 1815 {cells}/uL (ref 850–3900)
Lymphocytes Relative: 33 %
MCH: 30.5 pg (ref 27.0–33.0)
MCHC: 33 g/dL (ref 32.0–36.0)
MCV: 92.5 fL (ref 80.0–100.0)
MPV: 10.3 fL (ref 7.5–12.5)
Monocytes Absolute: 605 cells/uL (ref 200–950)
Monocytes Relative: 11 %
Neutro Abs: 2915 cells/uL (ref 1500–7800)
Neutrophils Relative %: 53 %
Platelets: 252 10*3/uL (ref 140–400)
RBC: 5.21 MIL/uL (ref 4.20–5.80)
RDW: 13.2 % (ref 11.0–15.0)
WBC: 5.5 10*3/uL (ref 3.8–10.8)

## 2017-02-13 LAB — LIPID PANEL
Cholesterol: 136 mg/dL (ref <200)
HDL: 54 mg/dL (ref >40)
LDL CALC: 68 mg/dL (ref <100)
Total CHOL/HDL Ratio: 2.5 Ratio (ref <5.0)
Triglycerides: 72 mg/dL (ref <150)
VLDL: 14 mg/dL (ref <30)

## 2017-02-13 LAB — PSA: PSA: 3 ng/mL (ref <=4.0)

## 2017-02-13 LAB — TESTOSTERONE: Testosterone: 234 ng/dL — ABNORMAL LOW (ref 250–827)

## 2017-02-13 LAB — TSH: TSH: 1.23 mIU/L (ref 0.40–4.50)

## 2017-04-15 ENCOUNTER — Ambulatory Visit (INDEPENDENT_AMBULATORY_CARE_PROVIDER_SITE_OTHER): Payer: 59 | Admitting: Cardiovascular Disease

## 2017-04-15 ENCOUNTER — Encounter: Payer: Self-pay | Admitting: *Deleted

## 2017-04-15 ENCOUNTER — Encounter: Payer: Self-pay | Admitting: Cardiovascular Disease

## 2017-04-15 VITALS — BP 142/90 | HR 66 | Ht 70.0 in | Wt 217.0 lb

## 2017-04-15 DIAGNOSIS — I2510 Atherosclerotic heart disease of native coronary artery without angina pectoris: Secondary | ICD-10-CM | POA: Diagnosis not present

## 2017-04-15 DIAGNOSIS — E782 Mixed hyperlipidemia: Secondary | ICD-10-CM | POA: Diagnosis not present

## 2017-04-15 DIAGNOSIS — I2583 Coronary atherosclerosis due to lipid rich plaque: Secondary | ICD-10-CM | POA: Diagnosis not present

## 2017-04-15 DIAGNOSIS — I10 Essential (primary) hypertension: Secondary | ICD-10-CM | POA: Diagnosis not present

## 2017-04-15 MED ORDER — VALSARTAN 80 MG PO TABS: 80.0000 mg | ORAL_TABLET | Freq: Every day | ORAL | 3 refills | Status: DC

## 2017-04-15 NOTE — Patient Instructions (Signed)
Your physician has recommended you make the following change in your medication:   1.) start new valsartan 80 mg prescription as directed on the bottle. This has been sent to your pharmacy.  Your physician recommends that you return for lab work in: 2-3 weeks.  Your physician has requested that you have en exercise stress myoview in September-October . For further information please visit [email or web address]. Please follow instruction sheet, as given.   Your physician recommends that you schedule a follow-up appointment in: October

## 2017-04-15 NOTE — Progress Notes (Signed)
Patient ID: Jonathan [name], male   DOB: Oct 25, 1961, 56 y.o.   MRN: [id number]     Primary MD: Jonathan [name]  HPI: Jonathan [name] is a 56 y.o. male presents to the office for a one-year followup cardiology evaluation.  Jonathan [name] suffered an acute coronary syndrome in November 2010 and was found to have totally occluded left circumflex marginal vessel at catheterization. A 2.75x18 mm vision stent was inserted by me with reestablishment of TIMI-3 flow, post dilated to 3 mm. He had a repeat catheterization in January 2012 and a stent was widely patent. He did have mild narrowing in the LAD. Additional problems include hyperlipidemia as well as hypertension. A nuclear perfusion study in August 2013 was unchanged from previously and showed a small area of mid to basal inferolateral scar. There was no ischemia. Post-rest ejection fraction was 67%.  When I saw him in 2015  his LDL cholesterol had risen to 101 and at that time, further titration of his Crestor to to 20 mg was recommended.  He had developed mild hyperkalemia on ACE inhibitor vision and his lisinopril dose was ultimately discontinued. Last year, laboratory by his primary physician Jonathan [name]  revealed hemoglobin 15.3, hematocrit 44.2.  Chemistry profile revealed potassium 4.7.  Renal function was excellent with a creatinine of 0.71 and a BUN of 10.  His most recent lipid studies showed a total cholesterol of 155, triglycerides 57, HDL 61, and his LDL had improved to 83.  He underwent a nuclear perfusion study on 05/11/2015. This remained low risk and only showed a very small region of scar the inferolateral wall at the base.  Normal function with an ejection fraction of 57%.  There was no ischemia.  Since I last saw him one year ago, he has continued to do well.  Typically denies chest pain or shortness of breath.  He remains active.  He denies exertional dyspnea.  He sees Jonathan [name], for primary care.  In March 2018.  Total  cholesterol was 136, HDL 54, LDL 68, and triglycerides 72.  He continues to be on Crestor 40 mg, and  omega-3 fatty acid.  He has not had recurrent CAD symptoms and continues to be on ASA/Plavix.  He has been stable with reference to the DOT.  Past Medical History:  Diagnosis Date  . Abnormal stress test 2011   SEHV-EF 59%, Moderate perfusion defect due to scar, mild periinfarct  . Coronary artery disease   . Echocardiogram 2011   mild posterior wall hypokinesis  . History of stress test 07/16/2012   abnormal myocardial perfusion study. No significant change from 08/03/2010 study, low risk scan.  [name] Hx of echocardiogram 12/14/2010  . Hyperlipidemia   . Hypertension   . Myocardial infarction [facility]) nov 2010   Jonathan [name]  . Peripheral vascular disease (HCC)   . Simvastatin-induced rhabdomyolysis   . Substance abuse    > 20 years ago, marijauna, cocaine, hash    Past Surgical History:  Procedure Laterality Date  . CARDIAC CATHETERIZATION  12/14/2010   By Dr Jonathan [name] showed a widely patent circumflex stent with mild plaque in his LAD.  [name] CORONARY STENT PLACEMENT  10/06/2009   Successful percutaneous coronary intervention of the circumflex marginal-2 vessel with percutaneous transluminal coronary angioplasty, stenting with a 2.75 x 18 mm non-drug eluting stent Vision Stent post-dilated to 3.0 mm.    Allergies  Allergen Reactions  . Other     apples  . Pantoprazole Other (See Comments)  More stomach problems    Current Outpatient Prescriptions  Medication Sig Dispense Refill  . Ascorbic Acid (VITAMIN C) 1000 MG tablet Take 1,000 mg by mouth 2 (two) times daily.      [name] aspirin EC 81 MG tablet Take 81 mg by mouth daily.      . B Complex-C (SUPER B COMPLEX PO) Take 1 tablet by mouth daily.      . clopidogrel (PLAVIX) 75 MG tablet TAKE 1 TABLET BY MOUTH EVERY DAY 90 tablet 3  . clotrimazole-betamethasone (LOTRISONE) cream Apply 1 application topically 2 (two) times daily. 60 g 0  .  Coenzyme Q10 (CO Q 10) 100 MG CAPS Take 1 capsule by mouth daily.      . Ginger, Zingiber officinalis, 550 MG CAPS Take 1 capsule by mouth daily.      . Magnesium 250 MG TABS Take 1 tablet by mouth daily.      . Multiple Vitamin (MULITIVITAMIN WITH MINERALS) TABS Take 1 tablet by mouth daily.      . nebivolol (BYSTOLIC) 5 MG tablet Take 1 tablet (5 mg total) by mouth daily. 90 tablet 3  . niacin (NIASPAN) 500 MG CR tablet Take 1 tablet (500 mg total) by mouth at bedtime. 90 tablet 3  . Omega-3 Fatty Acids (FISH OIL) 1200 MG CAPS Take 1 capsule by mouth daily.      . rosuvastatin (CRESTOR) 40 MG tablet TAKE 1 TABLET BY MOUTH EVERY DAY *INS WILL PAY 12/30* 90 tablet 3  . Saw Palmetto, Serenoa repens, (SAW PALMETTO BERRIES PO) Take 1 tablet by mouth daily.      . sildenafil (VIAGRA) 50 MG tablet Take 50-100 mg by mouth daily as needed for erectile dysfunction.    . vitamin E 400 UNIT capsule Take 400 Units by mouth daily.      . Zinc 50 MG TABS Take 1 tablet by mouth daily.      . valsartan (DIOVAN) 80 MG tablet Take 1 tablet (80 mg total) by mouth daily. 90 tablet 3   No current facility-administered medications for this visit.     Socially he is married for 31 years. He has 3 children 2 grandchildren. He completed 12th grade education. Works as a [employer]. He has a prior 30 year history of tobacco use but he quit in August 2008. He does drink occasional alcohol.  ROS General: Negative; No fevers, chills, or night sweats;  HEENT: Negative; No changes in vision or hearing, sinus congestion, difficulty swallowing Pulmonary: Negative; No cough, wheezing, shortness of breath, hemoptysis Cardiovascular: Negative; No chest pain, presyncope, syncope, palpatations GI: Positive for GERD; No nausea, vomiting, diarrhea, or abdominal pain GU: Positive for erectile dysfunction; No dysuria, hematuria, or difficulty voiding Musculoskeletal: Negative; no myalgias, joint pain, or  weakness Hematologic/Oncology: Negative; no easy bruising, bleeding Endocrine: Negative; no heat/cold intolerance; no diabetes Neuro: Negative; no changes in balance, headaches Skin: Negative; No rashes or skin lesions Psychiatric: Negative; No behavioral problems, depression Sleep: Negative; No snoring, daytime sleepiness, hypersomnolence, bruxism, restless legs, hypnogognic hallucinations, no cataplexy Other comprehensive 14 point system review is negative.  PE BP (!) 142/90   Pulse 66   Ht 5\' 10"  (1.778 m)   Wt 217 lb (98.4 kg)   BMI 31.14 kg/m    Repeat BP by me 158/88  Wt Readings from Last 3 Encounters:  04/15/17 217 lb (98.4 kg)  02/12/17 214 lb (97.1 kg)  04/30/16 207 lb 8 oz (94.1 kg)   General: Alert,  oriented, no distress.  Skin: normal turgor, no rashes HEENT: Normocephalic, atraumatic. Pupils round and reactive; sclera anicteric;no lid lag.  Nose without nasal septal hypertrophy Mouth/Parynx benign; Mallinpatti scale 2 Neck: No JVD, no carotid bruits with normal carotid upstroke Lungs: clear to ausculatation and percussion; no wheezing or rales Chest wall: Nontender to palpation.  He did have nipple rings bilaterally Heart: RRR, s1 s2 normal 1/ 6 systolic murmur.  No S3 gallop.  No diastolic murmur Abdomen: soft, nontender; no hepatosplenomehaly, BS+; abdominal aorta nontender and not dilated by palpation. Back: No CVA tenderness Pulses 2+ Extremities: no clubbing cyanosis or edema, Homan's sign negative  Neurologic: grossly nonfocal Psychological: Normal affect and mood  ECG (independently read by me):Normal sinus rhythm at 66 bpm.  No ST segment changes.  Normal intervals.  June 2017 ECG (independently read by me): Normal sinus rhythm at 65 bpm.  Normal intervals.  No ST segment changes.  July 2016 ECG (independently read by me):  Normal sinus rhythm at 69 bpm.  No ectopy.  Normal intervals.  No ECG evidence for prior infarction.  July 2015 ECG  (independently read by me): Normal sinus rhythm at 62 beats per minute.  Intervals, normal.  July 2014 ECG: Sinus rhythm 58 beats per minute. No significant ST changes.  LABS:  BMP Latest Ref Rng & Units 02/12/2017 11/03/2015 05/24/2015  Glucose 70 - 99 mg/dL 90 77 86  BUN 7 - 25 mg/dL 9 11 10   Creatinine 0.70 - 1.33 mg/dL 1.61 0.96 0.45  Sodium 135 - 146 mmol/L 141 137 140  Potassium 3.5 - 5.3 mmol/L 4.9 4.7 5.1  Chloride 98 - 110 mmol/L 106 102 105  CO2 20 - 31 mmol/L 25 26 26   Calcium 8.6 - 10.3 mg/dL 9.2 8.8 9.2   Hepatic Function Latest Ref Rng & Units 02/12/2017 11/03/2015 05/24/2015  Total Protein 6.1 - 8.1 g/dL 6.6 6.7 6.2  Albumin 3.6 - 5.1 g/dL 4.2 4.1 4.1  AST 10 - 35 U/L 25 19 19   ALT 9 - 46 U/L 27 22 18   Alk Phosphatase 40 - 115 U/L 53 52 55  Total Bilirubin 0.2 - 1.2 mg/dL 0.9 0.9 0.8  Bilirubin, Direct 0.0 - 0.3 mg/dL - - -   CBC Latest Ref Rng & Units 02/12/2017 11/03/2015 05/24/2015  WBC 3.8 - 10.8 K/uL 5.5 6.5 6.3  Hemoglobin 13.0 - 17.0 g/dL 40.9 81.1 91.4  Hematocrit 38.5 - 50.0 % 48.2 45.2 43.1  Platelets 140 - 400 K/uL 252 245 243   Lab Results  Component Value Date   MCV 92.5 02/12/2017   MCV 89.7 11/03/2015   MCV 89.2 05/24/2015   Lab Results  Component Value Date   TSH 1.23 02/12/2017   Lab Results  Component Value Date   HGBA1C  12/14/2010    5.3 (NOTE)                                                                       According to the ADA Clinical Practice Recommendations for 2011, when HbA1c is used as a screening test:   >=6.5%   Diagnostic of Diabetes Mellitus           (if abnormal result  is confirmed)  5.7-6.4%  Increased risk of developing Diabetes Mellitus  References:Diagnosis and Classification of Diabetes Mellitus,Diabetes Care,2011,34(Suppl 1):S62-S69 and Standards of Medical Care in         Diabetes - 2011,Diabetes Care,2011,34  (Suppl 1):S11-S61.   Lipid Panel     Component Value Date/Time   CHOL 136 02/12/2017 0950   TRIG 72  02/12/2017 0950   HDL 54 02/12/2017 0950   CHOLHDL 2.5 02/12/2017 0950   VLDL 14 02/12/2017 0950   LDLCALC 68 02/12/2017 0950     IMPRESSION:  1. Coronary artery disease due to lipid rich plaque   2. Essential hypertension, benign   3. Mixed hyperlipidemia      ASSESSMENT AND PLAN: Mr. Noguera is a 56 year old male who is status post his acute cardiac syndrome secondary to total circumflex marginal occlusion which was successfully intervened upon in November 2010.   At his last catheterization in January 2012 he had mild smooth in-stent narrowing of less than 30%.  His last  nuclear perfusion study in 04/2015  was unchanged and showed only a very small region of basal inferolateral scar without ischemia and remains low risk. His blood pressure today is mildly increased.  I discussed the new hypertensive guidelines.  I have recommended the addition of valsartan 80 mg and he will continue with his current dose of Bystolic 5 mg daily.  His ECG is stable with a resting pulse in the 60s.  He continues to be on dual antiplatelets therapy with aspirin and Plavix and denies bleeding.  He has mixed hyperlipidemia and remains on Crestor in addition to niacin and fish oil.  Laboratory in December 2016 showed a total cholesterol 166.  LDL was not at target and was still at 96 , and triglycerides had improved.  His most recent lipid panel is excellent with a total cholesterol of 136, triglycerides 72, HDL 54, VLDL 14, and LDL 68.  He is stable from my perspective with reference to his DOT requirements.  I will be scheduling him for follow-up nuclear perfusion study in September/October of this year for follow-up evaluation and will see him in the office subsequen to that study.  Time spent: 25 minutes  Lennette Bihari, MD, [facility]  04/17/2017 9:40 AM

## 2017-05-16 LAB — BASIC METABOLIC PANEL
BUN/Creatinine Ratio: 15 (ref 9–20)
BUN: 11 mg/dL (ref 6–24)
CALCIUM: 9.5 mg/dL (ref 8.7–10.2)
CO2: 24 mmol/L (ref 20–29)
CREATININE: 0.72 mg/dL — AB (ref 0.76–1.27)
Chloride: 105 mmol/L (ref 96–106)
GFR calc Af Amer: 119 mL/min/{1.73_m2} (ref >59)
GFR, EST NON AFRICAN AMERICAN: 103 mL/min/{1.73_m2} (ref >59)
GLUCOSE: 96 mg/dL (ref 65–99)
Potassium: 4.9 mmol/L (ref 3.5–5.2)
SODIUM: 144 mmol/L (ref 134–144)

## 2017-08-05 ENCOUNTER — Telehealth (HOSPITAL_COMMUNITY): Payer: Self-pay

## 2017-08-05 NOTE — Telephone Encounter (Signed)
Encounter complete. 

## 2017-08-06 ENCOUNTER — Ambulatory Visit (HOSPITAL_COMMUNITY)
Admission: RE | Admit: 2017-08-06 | Discharge: 2017-08-06 | Disposition: A | Discharge status: Home / Self Care | Payer: 59 | Source: Ambulatory Visit | Attending: Cardiovascular Disease | Admitting: Cardiovascular Disease

## 2017-08-06 DIAGNOSIS — I2510 Atherosclerotic heart disease of native coronary artery without angina pectoris: Secondary | ICD-10-CM | POA: Diagnosis not present

## 2017-08-06 DIAGNOSIS — I2583 Coronary atherosclerosis due to lipid rich plaque: Secondary | ICD-10-CM | POA: Insufficient documentation

## 2017-08-06 DIAGNOSIS — I10 Essential (primary) hypertension: Secondary | ICD-10-CM | POA: Diagnosis not present

## 2017-08-06 LAB — MYOCARDIAL PERFUSION IMAGING
CHL CUP MPHR: 162 {beats}/min
CSEPHR: 94 %
Estimated workload: 11.4 METS
Exercise duration (min): 11 min
Exercise duration (sec): 5 s
LV sys vol: 41 mL
LVDIAVOL: 118 mL (ref 62–150)
Peak HR: 153 {beats}/min
RPE: 18
Rest HR: 54 {beats}/min
SDS: 8
SRS: 7
SSS: 15
TID: 0.93

## 2017-08-06 MED ORDER — TECHNETIUM TC 99M TETROFOSMIN IV KIT
10.5000 | PACK | Freq: Once | INTRAVENOUS | Status: AC | PRN
  Administered 2017-08-06: 10.5 via INTRAVENOUS
  Filled 2017-08-06: qty 11

## 2017-08-06 MED ORDER — TECHNETIUM TC 99M TETROFOSMIN IV KIT
29.0000 | PACK | Freq: Once | INTRAVENOUS | Status: AC | PRN
  Administered 2017-08-06: 29 via INTRAVENOUS
  Filled 2017-08-06: qty 29

## 2017-10-03 ENCOUNTER — Ambulatory Visit (INDEPENDENT_AMBULATORY_CARE_PROVIDER_SITE_OTHER): Payer: 59 | Admitting: Cardiovascular Disease

## 2017-10-03 ENCOUNTER — Encounter: Payer: Self-pay | Admitting: Cardiovascular Disease

## 2017-10-03 VITALS — BP 130/82 | HR 71 | Ht 70.0 in | Wt 223.0 lb

## 2017-10-03 DIAGNOSIS — I2583 Coronary atherosclerosis due to lipid rich plaque: Secondary | ICD-10-CM | POA: Diagnosis not present

## 2017-10-03 DIAGNOSIS — I10 Essential (primary) hypertension: Secondary | ICD-10-CM | POA: Diagnosis not present

## 2017-10-03 DIAGNOSIS — E782 Mixed hyperlipidemia: Secondary | ICD-10-CM

## 2017-10-03 DIAGNOSIS — I2510 Atherosclerotic heart disease of native coronary artery without angina pectoris: Secondary | ICD-10-CM

## 2017-10-03 NOTE — Patient Instructions (Signed)

## 2017-10-03 NOTE — Progress Notes (Signed)
Patient ID: Jonathan [name], male   DOB: May 29, 1961, 56 y.o.   MRN: [id number]     Primary MD: Dr. Milinda Antis  HPI: Jonathan [name] is a 55 y.o. male presents to the office for a 6 month followup cardiology evaluation.  Jonathan [name] suffered an acute coronary syndrome in November 2010 and was found to have totally occluded left circumflex marginal vessel at catheterization. A 2.75x18 mm vision stent was inserted by me with reestablishment of TIMI-3 flow, post dilated to 3 mm. He had a repeat catheterization in January 2012 and a stent was widely patent. He did have mild narrowing in the LAD. Additional problems include hyperlipidemia as well as hypertension. A nuclear perfusion study in August 2013 was unchanged from previously and showed a small area of mid to basal inferolateral scar. There was no ischemia. Post-rest ejection fraction was 67%.  When I saw him in 2015  his LDL cholesterol had risen to 101 and at that time, further titration of his Crestor to to 20 mg was recommended.  He had developed mild hyperkalemia on ACE inhibitor vision and his lisinopril dose was ultimately discontinued. Last year, laboratory by his primary physician Dr. Kevin Fenton  revealed hemoglobin 15.3, hematocrit 44.2.  Chemistry profile revealed potassium 4.7.  Renal function was excellent with a creatinine of 0.71 and a BUN of 10.  His most recent lipid studies showed a total cholesterol of 155, triglycerides 57, HDL 61, and his LDL had improved to 83.  He underwent a nuclear perfusion study on 05/11/2015. This remained low risk and only showed a very small region of scar the inferolateral wall at the base.  Normal function with an ejection fraction of 57%.  There was no ischemia.  In March  2018 total cholesterol was 136, HDL 54, LDL 68, and triglycerides 72.  He continues to be on Crestor 40 mg, and  omega-3 fatty acid.  He has not had recurrent CAD symptoms and continues to be on ASA/Plavix.    Since I  last saw him, he remains very active.  He walks at least 5 days per week.  He denies chest pain, PND, orthopnea.  There is no shortness of breath.  As part of his DOT physical requirements he underwent a nuclear stress test on 08/06/2017.  EF was 65%.  No ST segment changes.  It was mild diaphragmatic attenuation artifact without scar or ischemia.  Study was interpreted as low risk.  He presents for follow-up evaluation.  Past Medical History:  Diagnosis Date  . Abnormal stress test 2011   SEHV-EF 59%, Moderate perfusion defect due to scar, mild periinfarct  . Coronary artery disease   . Echocardiogram 2011   mild posterior wall hypokinesis  . History of stress test 07/16/2012   abnormal myocardial perfusion study. No significant change from 08/03/2010 study, low risk scan.  [name] Hx of echocardiogram 12/14/2010  . Hyperlipidemia   . Hypertension   . Myocardial infarction [facility]) nov 2010   [name]  . Peripheral vascular disease (HCC)   . Simvastatin-induced rhabdomyolysis   . Substance abuse (HCC)    > 20 years ago, marijauna, cocaine, hash    Past Surgical History:  Procedure Laterality Date  . CARDIAC CATHETERIZATION  12/14/2010   By Dr [name] showed a widely patent circumflex stent with mild plaque in his LAD.  [name] CORONARY STENT PLACEMENT  10/06/2009   Successful percutaneous coronary intervention of the circumflex marginal-2 vessel with percutaneous transluminal coronary angioplasty, stenting with  a 2.75 x 18 mm non-drug eluting stent Vision Stent post-dilated to 3.0 mm.    Allergies  Allergen Reactions  . Other     apples  . Pantoprazole Other (See Comments)    More stomach problems    Current Outpatient Medications  Medication Sig Dispense Refill  . Ascorbic Acid (VITAMIN C) 1000 MG tablet Take 1,000 mg by mouth 2 (two) times daily.      [name] aspirin EC 81 MG tablet Take 81 mg by mouth daily.      . B Complex-C (SUPER B COMPLEX PO) Take 1 tablet by mouth daily.      .  clopidogrel (PLAVIX) 75 MG tablet TAKE 1 TABLET BY MOUTH EVERY DAY 90 tablet 3  . clotrimazole-betamethasone (LOTRISONE) cream Apply 1 application topically 2 (two) times daily. 60 g 0  . Coenzyme Q10 (CO Q 10) 100 MG CAPS Take 1 capsule by mouth daily.      . Ginger, Zingiber officinalis, 550 MG CAPS Take 1 capsule by mouth daily.      . Magnesium 250 MG TABS Take 1 tablet by mouth daily.      . Multiple Vitamin (MULITIVITAMIN WITH MINERALS) TABS Take 1 tablet by mouth daily.      . nebivolol (BYSTOLIC) 5 MG tablet Take 1 tablet (5 mg total) by mouth daily. 90 tablet 3  . niacin (NIASPAN) 500 MG CR tablet Take 1 tablet (500 mg total) by mouth at bedtime. 90 tablet 3  . Omega-3 Fatty Acids (FISH OIL) 1200 MG CAPS Take 1 capsule by mouth daily.      . rosuvastatin (CRESTOR) 40 MG tablet TAKE 1 TABLET BY MOUTH EVERY DAY *INS WILL PAY 12/30* 90 tablet 3  . Saw Palmetto, Serenoa repens, (SAW PALMETTO BERRIES PO) Take 1 tablet by mouth daily.      . sildenafil (VIAGRA) 50 MG tablet Take 50-100 mg by mouth daily as needed for erectile dysfunction.    . valsartan (DIOVAN) 80 MG tablet Take 1 tablet (80 mg total) by mouth daily. 90 tablet 3  . vitamin E 400 UNIT capsule Take 400 Units by mouth daily.      . Zinc 50 MG TABS Take 1 tablet by mouth daily.       No current facility-administered medications for this visit.     Socially he is married for 31 years. He has 3 children 2 grandchildren. He completed 12th grade education. Works as a [employer]. He has a prior 30 year history of tobacco use but he quit in August 2008. He does drink occasional alcohol.  ROS General: Negative; No fevers, chills, or night sweats;  HEENT: Negative; No changes in vision or hearing, sinus congestion, difficulty swallowing Pulmonary: Negative; No cough, wheezing, shortness of breath, hemoptysis Cardiovascular: Negative; No chest pain, presyncope, syncope, palpatations GI: Positive for GERD; No nausea, vomiting,  diarrhea, or abdominal pain GU: Positive for erectile dysfunction; No dysuria, hematuria, or difficulty voiding Musculoskeletal: Negative; no myalgias, joint pain, or weakness Hematologic/Oncology: Negative; no easy bruising, bleeding Endocrine: Negative; no heat/cold intolerance; no diabetes Neuro: Negative; no changes in balance, headaches Skin: Negative; No rashes or skin lesions Psychiatric: Negative; No behavioral problems, depression Sleep: Negative; No snoring, daytime sleepiness, hypersomnolence, bruxism, restless legs, hypnogognic hallucinations, no cataplexy Other comprehensive 14 point system review is negative.  PE BP 130/82   Pulse 71   Ht 5\' 10"  (1.778 m)   Wt 223 lb (101.2 kg)   BMI 32.00 kg/m  Repeat blood pressure by me was 130/75  Wt Readings from Last 3 Encounters:  10/03/17 223 lb (101.2 kg)  08/06/17 217 lb (98.4 kg)  04/15/17 217 lb (98.4 kg)   General: Alert, oriented, no distress.  Skin: normal turgor, no rashes, warm and dry HEENT: Normocephalic, atraumatic. Pupils equal round and reactive to light; sclera anicteric; extraocular muscles intact;  Nose without nasal septal hypertrophy Mouth/Parynx benign; Mallinpatti scale 2 Neck: No JVD, no carotid bruits; normal carotid upstroke Lungs: clear to ausculatation and percussion; no wheezing or rales Chest wall: without tenderness to palpitation Heart: PMI not displaced, RRR, s1 s2 normal, 1/6 systolic murmur, no diastolic murmur, no rubs, gallops, thrills, or heaves Abdomen: soft, nontender; no hepatosplenomehaly, BS+; abdominal aorta nontender and not dilated by palpation. Back: no CVA tenderness Pulses 2+ Musculoskeletal: full range of motion, normal strength, no joint deformities Extremities: no clubbing cyanosis or edema, Homan's sign negative  Neurologic: grossly nonfocal; Cranial nerves grossly wnl Psychologic: Normal mood and affect   ECG (independently read by me): Normal sinus rhythm at 71  bpm.  Early transition.  No ST segment changes.  May 2018 ECG (independently read by me):Normal sinus rhythm at 66 bpm.  No ST segment changes.  Normal intervals.  June 2017 ECG (independently read by me): Normal sinus rhythm at 65 bpm.  Normal intervals.  No ST segment changes.  July 2016 ECG (independently read by me):  Normal sinus rhythm at 69 bpm.  No ectopy.  Normal intervals.  No ECG evidence for prior infarction.  July 2015 ECG (independently read by me): Normal sinus rhythm at 62 beats per minute.  Intervals, normal.  July 2014 ECG: Sinus rhythm 58 beats per minute. No significant ST changes.  LABS:  BMP Latest Ref Rng & Units 05/15/2017 02/12/2017 11/03/2015  Glucose 65 - 99 mg/dL 96 90 77  BUN 6 - 24 mg/dL 11 9 11   Creatinine 0.76 - 1.27 mg/dL 1.02(V) 2.53 6.64  BUN/Creat Ratio 9 - 20 15 - -  Sodium 134 - 144 mmol/L 144 141 137  Potassium 3.5 - 5.2 mmol/L 4.9 4.9 4.7  Chloride 96 - 106 mmol/L 105 106 102  CO2 20 - 29 mmol/L 24 25 26   Calcium 8.7 - 10.2 mg/dL 9.5 9.2 8.8   Hepatic Function Latest Ref Rng & Units 02/12/2017 11/03/2015 05/24/2015  Total Protein 6.1 - 8.1 g/dL 6.6 6.7 6.2  Albumin 3.6 - 5.1 g/dL 4.2 4.1 4.1  AST 10 - 35 U/L 25 19 19   ALT 9 - 46 U/L 27 22 18   Alk Phosphatase 40 - 115 U/L 53 52 55  Total Bilirubin 0.2 - 1.2 mg/dL 0.9 0.9 0.8  Bilirubin, Direct 0.0 - 0.3 mg/dL - - -   CBC Latest Ref Rng & Units 02/12/2017 11/03/2015 05/24/2015  WBC 3.8 - 10.8 K/uL 5.5 6.5 6.3  Hemoglobin 13.0 - 17.0 g/dL 40.3 47.4 25.9  Hematocrit 38.5 - 50.0 % 48.2 45.2 43.1  Platelets 140 - 400 K/uL 252 245 243   Lab Results  Component Value Date   MCV 92.5 02/12/2017   MCV 89.7 11/03/2015   MCV 89.2 05/24/2015   Lab Results  Component Value Date   TSH 1.23 02/12/2017   Lab Results  Component Value Date   HGBA1C  12/14/2010    5.3 (NOTE)  According to the ADA Clinical Practice Recommendations for  2011, when HbA1c is used as a screening test:   >=6.5%   Diagnostic of Diabetes Mellitus           (if abnormal result  is confirmed)  5.7-6.4%   Increased risk of developing Diabetes Mellitus  References:Diagnosis and Classification of Diabetes Mellitus,Diabetes Care,2011,34(Suppl 1):S62-S69 and Standards of Medical Care in         Diabetes - 2011,Diabetes Care,2011,34  (Suppl 1):S11-S61.   Lipid Panel     Component Value Date/Time   CHOL 136 02/12/2017 0950   TRIG 72 02/12/2017 0950   HDL 54 02/12/2017 0950   CHOLHDL 2.5 02/12/2017 0950   VLDL 14 02/12/2017 0950   LDLCALC 68 02/12/2017 0950     IMPRESSION:  1. Coronary artery disease due to lipid rich plaque   2. Essential hypertension, benign   3. Mixed hyperlipidemia      ASSESSMENT AND PLAN: Jonathan [name] is a 56 year old male who suffered an acute coronary syndrome in November 2010 secondary to total circumflex marginal occlusion and underwent successful interventi  At his last catheterization in January 2012 he had mild smooth in-stent narrowing of less than 30%. A nuclear perfusion study in 04/2015  was unchanged and showed only a very small region of basal inferolateral scar without ischemia and remains low risk. His blood pressure today is mildly increased.  Part of his recent DOT requirements he underwent a repeat nuclear stress test.  This continues to show an inferior defect in the basal to mid region with normal ejection fraction and no wall motion abnormalities.  This was felt most likely due to attenuation artifact, as opposed to scar in the study was interpreted as low risk.,  His blood pressure remained stable on his current regimen of Bystolic 5 mg and valsartan 80 mg daily.  He continues to be on combination therapy with low-dose niacin and rosuvastatin 40 mg for mixed hyperlipidemia.  He continues to be on dual antiplatelet therapy and is tolerating this well without bleeding.  He also is on omega-3 fatty acids.  He  continues to exercise regularly and denies any symptoms of chest pain or angina.  He denies any decline in exercise tolerance.  I had reviewed his laboratory from earlier this year as remains stable.  Clinically he is doing well.  He tells me that need to see him in 6 months is part of his DOT requirements.  He will continue current medical therapy.    Time spent: 25 minutes  Lennette Bihari, MD, [facility]  10/05/2017 5:06 PM

## 2017-10-05 ENCOUNTER — Encounter: Payer: Self-pay | Admitting: Cardiovascular Disease

## 2017-12-16 ENCOUNTER — Other Ambulatory Visit: Payer: Self-pay | Admitting: Family Medicine

## 2018-01-02 ENCOUNTER — Other Ambulatory Visit: Payer: Self-pay | Admitting: Family Medicine

## 2018-01-31 ENCOUNTER — Other Ambulatory Visit: Payer: Self-pay | Admitting: Family Medicine

## 2018-02-22 ENCOUNTER — Other Ambulatory Visit: Payer: Self-pay | Admitting: Family Medicine

## 2018-02-23 ENCOUNTER — Other Ambulatory Visit: Payer: Self-pay | Admitting: *Deleted

## 2018-02-23 MED ORDER — ROSUVASTATIN CALCIUM 40 MG PO TABS: 40.0000 mg | ORAL_TABLET | Freq: Every day | ORAL | 0 refills | Status: DC

## 2018-02-24 ENCOUNTER — Other Ambulatory Visit: Payer: Self-pay | Admitting: Family Medicine

## 2018-03-25 ENCOUNTER — Other Ambulatory Visit: Payer: Self-pay | Admitting: Cardiovascular Disease

## 2018-03-25 NOTE — Telephone Encounter (Signed)
Rx(s) sent to pharmacy electronically.  

## 2018-03-31 ENCOUNTER — Other Ambulatory Visit: Payer: Self-pay | Admitting: Family Medicine

## 2018-04-01 ENCOUNTER — Encounter: Payer: Self-pay | Admitting: Adult Health

## 2018-04-01 ENCOUNTER — Ambulatory Visit (INDEPENDENT_AMBULATORY_CARE_PROVIDER_SITE_OTHER): Payer: 59 | Admitting: Adult Health

## 2018-04-01 VITALS — BP 138/80 | HR 60 | Ht 70.0 in | Wt 215.2 lb

## 2018-04-01 DIAGNOSIS — I10 Essential (primary) hypertension: Secondary | ICD-10-CM

## 2018-04-01 DIAGNOSIS — Z79899 Other long term (current) drug therapy: Secondary | ICD-10-CM

## 2018-04-01 DIAGNOSIS — I2510 Atherosclerotic heart disease of native coronary artery without angina pectoris: Secondary | ICD-10-CM

## 2018-04-01 DIAGNOSIS — E7800 Pure hypercholesterolemia, unspecified: Secondary | ICD-10-CM

## 2018-04-01 LAB — LIPID PANEL
Chol/HDL Ratio: 3.1 ratio (ref 0.0–5.0)
Cholesterol, Total: 164 mg/dL (ref 100–199)
HDL: 53 mg/dL (ref >39)
LDL Calculated: 89 mg/dL (ref 0–99)
Triglycerides: 110 mg/dL (ref 0–149)
VLDL CHOLESTEROL CAL: 22 mg/dL (ref 5–40)

## 2018-04-01 LAB — HEPATIC FUNCTION PANEL
ALK PHOS: 60 IU/L (ref 39–117)
ALT: 28 IU/L (ref 0–44)
AST: 23 IU/L (ref 0–40)
Albumin: 4.8 g/dL (ref 3.5–5.5)
Bilirubin Total: 1.2 mg/dL (ref 0.0–1.2)
Bilirubin, Direct: 0.27 mg/dL (ref 0.00–0.40)
TOTAL PROTEIN: 6.8 g/dL (ref 6.0–8.5)

## 2018-04-01 LAB — BASIC METABOLIC PANEL
BUN/Creatinine Ratio: 8 — ABNORMAL LOW (ref 9–20)
BUN: 8 mg/dL (ref 6–24)
CHLORIDE: 101 mmol/L (ref 96–106)
CO2: 26 mmol/L (ref 20–29)
Calcium: 9.8 mg/dL (ref 8.7–10.2)
Creatinine, Ser: 0.96 mg/dL (ref 0.76–1.27)
GFR calc non Af Amer: 86 mL/min/{1.73_m2} (ref >59)
GFR, EST AFRICAN AMERICAN: 100 mL/min/{1.73_m2} (ref >59)
Glucose: 82 mg/dL (ref 65–99)
Potassium: 4.9 mmol/L (ref 3.5–5.2)
Sodium: 141 mmol/L (ref 134–144)

## 2018-04-01 LAB — CBC
HEMATOCRIT: 43.5 % (ref 37.5–51.0)
Hemoglobin: 15.1 g/dL (ref 13.0–17.7)
MCH: 31.1 pg (ref 26.6–33.0)
MCHC: 34.7 g/dL (ref 31.5–35.7)
MCV: 90 fL (ref 79–97)
Platelets: 222 10*3/uL (ref 150–379)
RBC: 4.85 x10E6/uL (ref 4.14–5.80)
RDW: 13.1 % (ref 12.3–15.4)
WBC: 6.8 10*3/uL (ref 3.4–10.8)

## 2018-04-01 MED ORDER — VALSARTAN 80 MG PO TABS: 80.0000 mg | ORAL_TABLET | Freq: Every day | ORAL | 3 refills | Status: DC

## 2018-04-01 MED ORDER — ROSUVASTATIN CALCIUM 40 MG PO TABS: 40.0000 mg | ORAL_TABLET | Freq: Every day | ORAL | 3 refills | Status: DC

## 2018-04-01 MED ORDER — CLOPIDOGREL BISULFATE 75 MG PO TABS: 75.0000 mg | ORAL_TABLET | Freq: Every day | ORAL | 3 refills | Status: DC

## 2018-04-01 MED ORDER — NEBIVOLOL HCL 5 MG PO TABS: 5.0000 mg | ORAL_TABLET | Freq: Every day | ORAL | 3 refills | Status: DC

## 2018-04-01 NOTE — Progress Notes (Signed)
Cardiology Office Note   Date:  04/01/2018   ID:  Jonathan [name], DOB Aug 13, 1961, MRN [id number]  PCP:  Jonathan Scarlet, MD  Cardiologist:  Jonathan [name]   Chief Complaint  Patient presents with  . Follow-up    one year per pt, denies chest pains, SOB, swelling in hands/feet     History of Present Illness: DEDRICK [name] is a 57 y.o. male who presents for ongoing assessment and management of coronary artery disease: Patient had catheterization in 2010 revealing totally occluded left circumflex marginal with a 2.75 x 18 mm vision stent placed, repeat cardiac catheterization in January 2012 revealed that the stent was widely patent.  He was found to have some mild narrowing of the LAD.  Other history includes hyperlipidemia and hypertension.    The patient was taken off of ACE inhibitor in the setting of hyperkalemia he remained on Crestor for control of hypercholesterolemia.  Repeat nuclear medicine study in September 2018, remain low risk it showed only a very small region of scar in the inferior lateral wall at the base.  Normal LV systolic function 57%.  He remains on dual antiplatelet therapy with aspirin and Plavix.  The patient is very active and walks 5 days a week.  He was last seen by Jonathan [name] November 2018 was doing well.  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  The patient is feeling well and offers no cardiac complaints.  He has not had any hospitalizations, surgeries, new diagnoses, ER visits, or new allergies to medications since being seen last.   Past Medical History:  Diagnosis Date  . Abnormal stress test 2011   Jonathan 57%, Moderate perfusion defect due to scar, mild periinfarct  . Coronary artery disease   . Echocardiogram 2011   mild posterior wall hypokinesis  . History of stress test 07/16/2012   abnormal myocardial perfusion study. No significant change from 08/03/2010 study, low risk scan.  [name] Hx of echocardiogram 12/14/2010  .  Hyperlipidemia   . Hypertension   . Myocardial infarction Jonathan [name]) nov 2010   Jonathan [name]  . Peripheral vascular disease (HCC)   . Simvastatin-induced rhabdomyolysis   . Substance abuse (HCC)    > 20 years ago, marijauna, cocaine, hash    Past Surgical History:  Procedure Laterality Date  . CARDIAC CATHETERIZATION  12/14/2010   By Dr Jonathan [name] showed a widely patent circumflex stent with mild plaque in his LAD.  [name] CORONARY STENT PLACEMENT  10/06/2009   Successful percutaneous coronary intervention of the circumflex marginal-2 vessel with percutaneous transluminal coronary angioplasty, stenting with a 2.75 x 18 mm non-drug eluting stent Vision Stent post-dilated to 3.0 mm.     Current Outpatient Medications  Medication Sig Dispense Refill  . Ascorbic Acid (VITAMIN C) 1000 MG tablet Take 1,000 mg by mouth 2 (two) times daily.      [name] aspirin EC 81 MG tablet Take 81 mg by mouth daily.      . B Complex-C (SUPER B COMPLEX PO) Take 1 tablet by mouth daily.      . clopidogrel (PLAVIX) 75 MG tablet Take 1 tablet (75 mg total) by mouth daily. No further refills without an appointment! 90 tablet 3  . clotrimazole-betamethasone (LOTRISONE) cream APPLY TO AFFECTED AREA TWICE DAILY 45 g 0  . Coenzyme Q10 (CO Q 10) 100 MG CAPS Take 1 capsule by mouth daily.      . Ginger, Zingiber officinalis, 550 MG CAPS Take 1 capsule by  mouth daily.      . Magnesium 250 MG TABS Take 1 tablet by mouth daily.      . Multiple Vitamin (MULITIVITAMIN WITH MINERALS) TABS Take 1 tablet by mouth daily.      . nebivolol (BYSTOLIC) 5 MG tablet Take 1 tablet (5 mg total) by mouth daily. 90 tablet 3  . niacin (NIASPAN) 500 MG CR tablet Take 1 tablet (500 mg total) by mouth at bedtime. 90 tablet 3  . Omega-3 Fatty Acids (FISH OIL) 1200 MG CAPS Take 1 capsule by mouth daily.      . rosuvastatin (CRESTOR) 40 MG tablet Take 1 tablet (40 mg total) by mouth daily. Requires office visit before any further refills can be given. 90 tablet  3  . Saw Palmetto, Serenoa repens, (SAW PALMETTO BERRIES PO) Take 1 tablet by mouth daily.      . sildenafil (VIAGRA) 50 MG tablet Take 50-100 mg by mouth daily as needed for erectile dysfunction.    . valsartan (DIOVAN) 80 MG tablet Take 1 tablet (80 mg total) by mouth daily. 90 tablet 3  . vitamin E 400 UNIT capsule Take 400 Units by mouth daily.      . Zinc 50 MG TABS Take 1 tablet by mouth daily.       No current facility-administered medications for this visit.     Allergies:   Other and Pantoprazole    Social History:  The patient  reports that he quit smoking about 11 years ago. He has never used smokeless tobacco. He reports that he drinks about 3.5 - 5.0 oz of alcohol per week. He reports that he does not use drugs.   Family History:  The patient's family history includes Arthritis in his mother; Cancer in his father and maternal grandmother; Cancer - Lung in his maternal grandmother; Hyperlipidemia in his brother, father, mother, and sister; Hypertension in his brother, father, mother, and sister.    ROS: All other systems are reviewed and negative. Unless otherwise mentioned in H&P    PHYSICAL EXAM: VS:  BP 138/80 (BP Location: Right Arm)   Pulse 60   Ht 5\' 10"  (1.778 m)   Wt 215 lb 3.2 oz (97.6 kg)   BMI 30.88 kg/m  , BMI Body mass index is 30.88 kg/m. GEN: Well nourished, well developed, in no acute distress  HEENT: normal edentulous Neck: no JVD, carotid bruits, or masses Cardiac: RRR; no murmurs, rubs, or gallops,no edema  Respiratory:  Clear to auscultation bilaterally, normal work of breathing GI: soft, nontender, nondistended, + BS MS: no deformity or atrophy  Skin: warm and dry, no rash Neuro:  Strength and sensation are intact Psych: euthymic mood, full affect   EKG: NSR rate of 60 bpm.   Recent Labs: 05/15/2017: BUN 11; Creatinine, Ser 0.72; Potassium 4.9; Sodium 144    Lipid Panel    Component Value Date/Time   CHOL 136 02/12/2017 0950   TRIG  72 02/12/2017 0950   HDL 54 02/12/2017 0950   CHOLHDL 2.5 02/12/2017 0950   VLDL 14 02/12/2017 0950   LDLCALC 68 02/12/2017 0950      Wt Readings from Last 3 Encounters:  04/01/18 215 lb 3.2 oz (97.6 kg)  10/03/17 223 lb (101.2 kg)  08/06/17 217 lb (98.4 kg)      Other studies Reviewed:  NM Stress Test 08/06/2017   The left ventricular ejection fraction is normal (55-65%).  Nuclear stress EF: 65%.  Blood pressure demonstrated a normal response to  exercise.  There was no ST segment deviation noted during stress.  There is a medium defect of moderate severity present in the basal inferoseptal, basal inferior, mid inferoseptal and mid inferior location.In the setting of normal LVF this is consistent with diaphragmatic attenuation artifact. No ischemia noted.  This is a low risk study.      ASSESSMENT AND PLAN:  1. CAD: He is asymptomatic, medically complaint. Last stress test 07/2017. He will need repeat stress test in 2020 for ongoing DOT documentation as he needs this every two years. He is given refills on all of his cardiac medications. He will have BMET completed for kidney function and CBC as he is on antiplatelet therapy.   2. Hypercholesterolemia: Remains on statin therapy. Check fasting lipids and LFTs today.   3. Hypertension: Currently well controlled.. Continue valsartan.    Current medicines are reviewed at length with the patient today.  Letter provided for him to take to DOT to continue to drive from cardiac standpoint.   Labs/ tests ordered today include: BMET, CBC, L/L.   Bettey Mare. Liborio Nixon, ANP, AACC   04/01/2018 11:22 AM    [facility] [address] [place], [place] [address] Phone: [phone]; Fax: [phone]

## 2018-04-01 NOTE — Patient Instructions (Signed)
Medication Instructions:  NO CHANGES- Your physician recommends that you continue on your current medications as directed. Please refer to the Current Medication list given to you today.  If you need a refill on your cardiac medications before your next appointment, please call your pharmacy.  Labwork: BMET, CBC, FLP AND LFT TODAY HERE IN OUR OFFICE AT [facility]  Take the provided lab slips with you to the lab for your blood draw.   Follow-Up: Your physician wants you to follow-up in: 12 MONTHS WITH DR Tresa Endo You should receive a reminder letter in the mail two months in advance. If you do not receive a letter, please call our office MARCH 2020 to schedule the MAY 2020 follow-up appointment.   Thank you for choosing [facility] at [facility]!!

## 2018-04-07 ENCOUNTER — Other Ambulatory Visit: Payer: Self-pay

## 2018-04-07 MED ORDER — EZETIMIBE 10 MG PO TABS: 10.0000 mg | ORAL_TABLET | Freq: Every day | ORAL | 3 refills | Status: DC

## 2018-04-07 NOTE — Progress Notes (Signed)
zetia sent to pharmacy requested

## 2018-05-20 ENCOUNTER — Encounter: Payer: Self-pay | Admitting: Family Medicine

## 2018-05-20 ENCOUNTER — Other Ambulatory Visit: Payer: Self-pay

## 2018-05-20 ENCOUNTER — Ambulatory Visit (INDEPENDENT_AMBULATORY_CARE_PROVIDER_SITE_OTHER): Payer: 59 | Admitting: Family Medicine

## 2018-05-20 VITALS — BP 136/72 | HR 84 | Temp 98.4°F | Resp 16 | Ht 70.0 in | Wt 216.0 lb

## 2018-05-20 DIAGNOSIS — Z0000 Encounter for general adult medical examination without abnormal findings: Secondary | ICD-10-CM

## 2018-05-20 DIAGNOSIS — E782 Mixed hyperlipidemia: Secondary | ICD-10-CM | POA: Diagnosis not present

## 2018-05-20 DIAGNOSIS — I10 Essential (primary) hypertension: Secondary | ICD-10-CM

## 2018-05-20 DIAGNOSIS — N529 Male erectile dysfunction, unspecified: Secondary | ICD-10-CM

## 2018-05-20 DIAGNOSIS — Z125 Encounter for screening for malignant neoplasm of prostate: Secondary | ICD-10-CM

## 2018-05-20 LAB — PSA: PSA: 3 ng/mL (ref <=4.0)

## 2018-05-20 NOTE — Assessment & Plan Note (Signed)
Return for fasting lipid in 2 months

## 2018-05-20 NOTE — Patient Instructions (Signed)
F/u 1 YEAR FOR physical F/U end of August for labs for cholesterol Referral Urology

## 2018-05-20 NOTE — Assessment & Plan Note (Signed)
CPE done, PSA to be done No change to meds Plan for shingrix age 57 due to insurance

## 2018-05-20 NOTE — Assessment & Plan Note (Signed)
Referral to urology to discuss penile implant 

## 2018-05-20 NOTE — Assessment & Plan Note (Signed)
Well controlled no changes 

## 2018-05-20 NOTE — Progress Notes (Signed)
Subjective:    Patient ID: Jonathan [name], male    DOB: Oct 22, 1961, 57 y.o.   MRN: [id number]  Patient presents for CPE (is fasting)   Pt here for CPE, medications reviewed   Colonoscopy- UTD  Immunizations- TDAP UTD, discussed shingles vaccine- not covered by insurance until age 71   Discussed HEP C/HIV screening - Declines    CAD- followed by cardiology,reviewed visit from mAYno changes, LF/BMET /lipids/CBC normal in May 2019   BPH- no longer follows with urology / Needs referral for possible implant as Viagra does not help, with CAD, no testosterone        Review Of Systems:  GEN- denies fatigue, fever, weight loss,weakness, recent illness HEENT- denies eye drainage, change in vision, nasal discharge, CVS- denies chest pain, palpitations RESP- denies SOB, cough, wheeze ABD- denies N/V, change in stools, abd pain GU- denies dysuria, hematuria, dribbling, incontinence MSK- denies joint pain, muscle aches, injury Neuro- denies headache, dizziness, syncope, seizure activity       Objective:    BP 136/72   Pulse 84   Temp 98.4 F (36.9 C) (Oral)   Resp 16   Ht 5\' 10"  (1.778 m)   Wt 216 lb (98 kg)   SpO2 99%   BMI 30.99 kg/m  GEN- NAD, alert and oriented x3 HEENT- PERRL, EOMI, non injected sclera, pink conjunctiva, MMM, oropharynx clear, TM clear bilat  Neck- Supple, no thyromegaly, no bruit  CVS- RRR, no murmur RESP-CTAB ABD-NABS,soft,NT,ND EXT- No edema Pulses- Radial, DP- 2+        Assessment & Plan:      Problem List Items Addressed This Visit      Unprioritized   Erectile dysfunction    Referral to urology to discuss penile implant       Essential hypertension, benign    Well controlled no changes       Hyperlipidemia    Return for fasting lipid in 2 months      Routine general medical examination at a health care facility - Primary    CPE done, PSA to be done No change to meds Plan for shingrix age 1 due to insurance        Other Visit Diagnoses    Prostate cancer screening       Relevant Orders   PSA      Note: This dictation was prepared with Dragon dictation along with smaller phrase technology. Any transcriptional errors that result from this process are unintentional.

## 2018-05-25 ENCOUNTER — Encounter: Payer: Self-pay | Admitting: *Deleted

## 2018-05-29 ENCOUNTER — Encounter: Payer: Self-pay | Admitting: *Deleted

## 2018-06-28 ENCOUNTER — Other Ambulatory Visit: Payer: Self-pay | Admitting: Adult Health

## 2018-06-29 NOTE — Telephone Encounter (Signed)
Rx request sent to pharmacy.  

## 2018-10-20 ENCOUNTER — Other Ambulatory Visit: Payer: Self-pay | Admitting: Adult Health

## 2019-02-09 ENCOUNTER — Ambulatory Visit: Payer: 59 | Admitting: Urology

## 2019-03-31 ENCOUNTER — Other Ambulatory Visit: Payer: Self-pay

## 2019-03-31 MED ORDER — CLOPIDOGREL BISULFATE 75 MG PO TABS: 75.0000 mg | ORAL_TABLET | Freq: Every day | ORAL | 3 refills | Status: DC

## 2019-04-01 ENCOUNTER — Other Ambulatory Visit: Payer: Self-pay

## 2019-04-01 ENCOUNTER — Telehealth: Payer: Self-pay | Admitting: Cardiovascular Disease

## 2019-04-01 ENCOUNTER — Telehealth: Payer: 59 | Admitting: Cardiovascular Disease

## 2019-04-01 MED ORDER — CLOPIDOGREL BISULFATE 75 MG PO TABS: 75.0000 mg | ORAL_TABLET | Freq: Every day | ORAL | 1 refills | Status: DC

## 2019-04-01 NOTE — Telephone Encounter (Signed)
Called patient to answer questions about appointment.Patient stated he was not informed about the cancellation of his appointment and he needed this appointment to get a letter from Dr. Tresa Endo so he could have a D.O.T. physical. Will route this message to Dr. Landry Dyke nurse.

## 2019-04-01 NOTE — Telephone Encounter (Signed)
New Message     Pt is calling because he said he was suppose to have his virtual visit today and has been waiting and no one called.  He said he was not informed of it being canceled  and rescheduled.  He said he needed this appt to be cleared for work   Please call

## 2019-04-02 NOTE — Telephone Encounter (Signed)
Spoke to pt and rescheduled his appt to 5/19 at 4 PM with Joni Reining for DOT. Pt stated he does have a smartphone and agreed to try video visit. Gave verbal consent.

## 2019-04-04 NOTE — Progress Notes (Signed)
Virtual Visit via Video Note   This visit type was conducted due to national recommendations for restrictions regarding the COVID-19 Pandemic (e.g. social distancing) in an effort to limit this patient's exposure and mitigate transmission in our community.  Due to his co-morbid illnesses, this patient is at least at moderate risk for complications without adequate follow up.  This format is felt to be most appropriate for this patient at this time.  All issues noted in this document were discussed and addressed.  A limited physical exam was performed with this format.  Please refer to the patient's chart for his consent to telehealth for [facility].   Date:  04/06/2019   ID:  Jonathan [name], DOB [birth year], MRN [id number]  Patient Location: Home Provider Location: Home  PCP:  Salley Scarlet, MD  Cardiologist:  Dr. Tresa Endo Electrophysiologist:  None   Evaluation Performed:  Follow-Up Visit  Chief Complaint:  DOT follow up  History of Present Illness:    Jonathan [name] is a 58 y.o. male we are following be a telehealth medicine visit with known history of coronary artery disease, with history of totally occluded left circumflex marginal status post vision drug-eluting stent placed, with repeat cardiac catheterization in 2012 which revealed stent was widely patent.  He was also found to have some mild narrowing of the LAD.  Other history includes hyperlipidemia and hypertension  The patient was taken off of ACE inhibitor in the setting of hyperkalemia he remained on Crestor for control of hypercholesterolemia.  Repeat nuclear medicine study in September 2018, remain low risk it showed only a very small region of scar in the inferior lateral wall at the base.  Normal LV systolic function 57%.  He remains on dual antiplatelet therapy with aspirin and Plavix.  The patient is very active and walks 5 days a week.  He was last seen by Dr. Tresa Endo November 2018 was doing well.    Follow-up labs included lipids and LFTs with a LDL calculated at 89 and total cholesterol 164.  He was started on Zetia 10 mg daily in addition to his statin therapy.  He will need follow-up labs today.  He is here for annual follow-up and cardiac evaluation for DOT requirements.  He was last seen in the office on 04/01/2018. He denies symptoms of chest pain , DOE, leg pain or fatigue. He is medically compliant.   The patient does not have symptoms concerning for COVID-19 infection (fever, chills, cough, or new shortness of breath).    Past Medical History:  Diagnosis Date  . Abnormal stress test 2011   SEHV-EF 59%, Moderate perfusion defect due to scar, mild periinfarct  . Coronary artery disease   . Echocardiogram 2011   mild posterior wall hypokinesis  . History of stress test 07/16/2012   abnormal myocardial perfusion study. No significant change from 08/03/2010 study, low risk scan.  [name] Hx of echocardiogram 12/14/2010  . Hyperlipidemia   . Hypertension   . Myocardial infarction [facility]) nov 2010   [name]  . Peripheral vascular disease (HCC)   . Simvastatin-induced rhabdomyolysis   . Substance abuse (HCC)    > 20 years ago, marijauna, cocaine, hash   Past Surgical History:  Procedure Laterality Date  . CARDIAC CATHETERIZATION  12/14/2010   By Dr [name] showed a widely patent circumflex stent with mild plaque in his LAD.  [name] CORONARY STENT PLACEMENT  10/06/2009   Successful percutaneous coronary intervention of the circumflex marginal-2 vessel with  percutaneous transluminal coronary angioplasty, stenting with a 2.75 x 18 mm non-drug eluting stent Vision Stent post-dilated to 3.0 mm.     Current Meds  Medication Sig  . Ascorbic Acid (VITAMIN C) 1000 MG tablet Take 1,000 mg by mouth 2 (two) times daily.    [name] aspirin EC 81 MG tablet Take 81 mg by mouth daily.    . B Complex-C (SUPER B COMPLEX PO) Take 1 tablet by mouth daily.    . clotrimazole-betamethasone (LOTRISONE) cream  APPLY TO AFFECTED AREA TWICE DAILY  . Coenzyme Q10 (CO Q 10) 100 MG CAPS Take 1 capsule by mouth daily.    . Ginger, Zingiber officinalis, 550 MG CAPS Take 1 capsule by mouth daily.    . Magnesium 250 MG TABS Take 1 tablet by mouth daily.    . Multiple Vitamin (MULITIVITAMIN WITH MINERALS) TABS Take 1 tablet by mouth daily.    . niacin (NIASPAN) 500 MG CR tablet Take 1 tablet (500 mg total) by mouth at bedtime.  . Omega-3 Fatty Acids (FISH OIL) 1200 MG CAPS Take 1 capsule by mouth daily.    . Saw Palmetto, Serenoa repens, (SAW PALMETTO BERRIES PO) Take 1 tablet by mouth daily.    . sildenafil (VIAGRA) 50 MG tablet Take 50-100 mg by mouth daily as needed for erectile dysfunction.  . valsartan (DIOVAN) 80 MG tablet Take 1 tablet (80 mg total) by mouth daily.  . vitamin E 400 UNIT capsule Take 400 Units by mouth daily.    . Zinc 50 MG TABS Take 1 tablet by mouth daily.    . [DISCONTINUED] clopidogrel (PLAVIX) 75 MG tablet Take 1 tablet (75 mg total) by mouth daily. PLEASE SCHEDULE AN APPT FOR FUTURE REFILLS No further refills without an appointment!  . [DISCONTINUED] ezetimibe (ZETIA) 10 MG tablet TAKE 1 TABLET BY MOUTH EVERY DAY  . [DISCONTINUED] nebivolol (BYSTOLIC) 5 MG tablet Take 1 tablet (5 mg total) by mouth daily.  . [DISCONTINUED] rosuvastatin (CRESTOR) 40 MG tablet Take 1 tablet (40 mg total) by mouth daily. Requires office visit before any further refills can be given.     Allergies:   Other and Pantoprazole   Social History   Tobacco Use  . Smoking status: Former Smoker    Last attempt to quit: 01/07/2007    Years since quitting: 12.2  . Smokeless tobacco: Never Used  Substance Use Topics  . Alcohol use: Yes    Alcohol/week: 7.0 - 10.0 standard drinks    Types: 7 - 10 drink(s) per week  . Drug use: No     Family Hx: The patient's family history includes Arthritis in his mother; Cancer in his father and maternal grandmother; Cancer - Lung in his maternal grandmother;  Hyperlipidemia in his brother, father, mother, and sister; Hypertension in his brother, father, mother, and sister.  ROS:   Please see the history of present illness.    All other systems reviewed and are negative.   Prior CV studies:   The following studies were reviewed today: NM Stress Test 08/06/2017   The left ventricular ejection fraction is normal (55-65%).  Nuclear stress EF: 65%.  Blood pressure demonstrated a normal response to exercise.  There was no ST segment deviation noted during stress.  There is a medium defect of moderate severity present in the basal inferoseptal, basal inferior, mid inferoseptal and mid inferior location.In the setting of normal LVF this is consistent with diaphragmatic attenuation artifact. No ischemia noted.  This is a low  risk study.     Labs/Other Tests and Data Reviewed:    EKG:  No ECG reviewed.  Recent Labs: No results found for requested labs within last 8760 hours.   Recent Lipid Panel Lab Results  Component Value Date/Time   CHOL 164 04/01/2018 11:15 AM   TRIG 110 04/01/2018 11:15 AM   HDL 53 04/01/2018 11:15 AM   CHOLHDL 3.1 04/01/2018 11:15 AM   CHOLHDL 2.5 02/12/2017 09:50 AM   LDLCALC 89 04/01/2018 11:15 AM    Wt Readings from Last 3 Encounters:  04/06/19 220 lb (99.8 kg)  05/20/18 216 lb (98 kg)  04/01/18 215 lb 3.2 oz (97.6 kg)     Objective:    Vital Signs:  Ht 5\' 10"  (1.778 m)   Wt 220 lb (99.8 kg)   BMI 31.57 kg/m    VITAL SIGNS:  reviewed GEN:  no acute distress RESPIRATORY:  normal respiratory effort, symmetric expansion NEURO:  alert and oriented x 3, no obvious focal deficit PSYCH:  normal affect  No blood pressure available to review.   ASSESSMENT & PLAN:    1. CAD: Hx of CTO of left circumflex marginal s/p DES and mild narrowing of the LAD per cath in 2012. Has NM stress tests every 2 years for DOT requirements. Due this time next year. He is asymptomatic and medically compliant. He will  needs labs for annual evaluation. 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 He will need repeat NM stress test in 6 months.   2. Hypertension: Does not have BP records available. Has not had issues with BP. Will continue Diovan, nevbivolol, as directed.   3. Hypercholesterolemia; Continue rosuvastatin. Follow up lipids and LFT's will be ordered.   4. Obesity: Weight loss and increase activity is recommended.   COVID-19 Education: The signs and symptoms of COVID-19 were discussed with the patient and how to seek care for testing (follow up with PCP or arrange E-visit).  The importance of social distancing was discussed today.  Time:   Today, I have spent 15 minutes with the patient with telehealth technology discussing the above problems.     Medication Adjustments/Labs and Tests Ordered: Current medicines are reviewed at length with the patient today.  Concerns regarding medicines are outlined above.   Tests Ordered: No orders of the defined types were placed in this encounter.   Medication Changes: No orders of the defined types were placed in this encounter.   Disposition:  Follow up in 6 month(s)  Signed, Bettey Mare. Liborio Nixon, ANP, AACC  04/06/2019 4:45 PM     [facility]

## 2019-04-05 ENCOUNTER — Telehealth: Payer: Self-pay | Admitting: Adult Health

## 2019-04-05 NOTE — Telephone Encounter (Signed)
Mychart, smartphone, consent, pre reg complete 04/05/19 AF

## 2019-04-06 ENCOUNTER — Encounter: Payer: Self-pay | Admitting: Adult Health

## 2019-04-06 ENCOUNTER — Telehealth (INDEPENDENT_AMBULATORY_CARE_PROVIDER_SITE_OTHER): Payer: 59 | Admitting: Adult Health

## 2019-04-06 ENCOUNTER — Telehealth: Payer: Self-pay

## 2019-04-06 ENCOUNTER — Other Ambulatory Visit: Payer: Self-pay

## 2019-04-06 VITALS — Ht 70.0 in | Wt 220.0 lb

## 2019-04-06 DIAGNOSIS — I2583 Coronary atherosclerosis due to lipid rich plaque: Secondary | ICD-10-CM

## 2019-04-06 DIAGNOSIS — I2510 Atherosclerotic heart disease of native coronary artery without angina pectoris: Secondary | ICD-10-CM

## 2019-04-06 DIAGNOSIS — I10 Essential (primary) hypertension: Secondary | ICD-10-CM

## 2019-04-06 DIAGNOSIS — E782 Mixed hyperlipidemia: Secondary | ICD-10-CM

## 2019-04-06 MED ORDER — ROSUVASTATIN CALCIUM 40 MG PO TABS: 40.0000 mg | ORAL_TABLET | Freq: Every day | ORAL | 3 refills | Status: DC

## 2019-04-06 MED ORDER — VALSARTAN 80 MG PO TABS: 80.0000 mg | ORAL_TABLET | Freq: Every day | ORAL | 1 refills | Status: DC

## 2019-04-06 MED ORDER — EZETIMIBE 10 MG PO TABS: 10.0000 mg | ORAL_TABLET | Freq: Every day | ORAL | 1 refills | Status: DC

## 2019-04-06 MED ORDER — CLOPIDOGREL BISULFATE 75 MG PO TABS: 75.0000 mg | ORAL_TABLET | Freq: Every day | ORAL | 1 refills | Status: DC

## 2019-04-06 MED ORDER — NEBIVOLOL HCL 5 MG PO TABS: 5.0000 mg | ORAL_TABLET | Freq: Every day | ORAL | 3 refills | Status: DC

## 2019-04-06 NOTE — Telephone Encounter (Signed)
Left 2 messages on voicemail for Pt to call office back to prechart before 4:00 visit with Joni Reining on May 19

## 2019-04-06 NOTE — Patient Instructions (Signed)
Medication Instructions:  Your physician recommends that you continue on your current medications as directed. Please refer to the Current Medication list given to you today.  If you need a refill on your cardiac medications before your next appointment, please call your pharmacy.   Lab work: Your physician recommends that you return for a FASTING lipid profile, hepatic function panel, CBC, BMET, and Hemoglobin A1C at your earliest convenience.  You can have this blood work done in our office, no appointment necessary. Or, you can call our [address] office at [phone] to schedule an appointment for blood work there.  If you have labs (blood work) drawn today and your tests are completely normal, you will receive your results only by: [name] MyChart Message (if you have MyChart) OR . A paper copy in the mail If you have any lab test that is abnormal or we need to change your treatment, we will call you to review the results.  I am mailing your lab slips to you--please take them with you when you go for your blood work.  Follow-Up: At [facility], you and your health needs are our priority.  As part of our continuing mission to provide you with exceptional heart care, we have created designated Provider Care Teams.  These Care Teams include your primary Cardiologist (physician) and Advanced Practice Providers (APPs -  Physician Assistants and Nurse Practitioners) who all work together to provide you with the care you need, when you need it. You will need a follow up appointment in 6 months.  Please call our office 2 months in advance to schedule this appointment.  You may see Nicki Guadalajara, MD or one of the following Advanced Practice Providers on your designated Care Team: [place], [place] . Micah Flesher, PA-C  Any Other Special Instructions Will Be Listed Below (If Applicable). I am also mailing you a copy of the letter for DOT clearance.

## 2019-06-08 ENCOUNTER — Telehealth: Payer: 59 | Admitting: Cardiovascular Disease

## 2019-09-26 ENCOUNTER — Other Ambulatory Visit: Payer: Self-pay | Admitting: Adult Health

## 2019-10-02 ENCOUNTER — Other Ambulatory Visit: Payer: Self-pay | Admitting: Adult Health

## 2020-04-02 ENCOUNTER — Other Ambulatory Visit: Payer: Self-pay | Admitting: Adult Health

## 2020-04-05 ENCOUNTER — Other Ambulatory Visit: Payer: Self-pay | Admitting: Adult Health

## 2020-04-11 ENCOUNTER — Other Ambulatory Visit: Payer: Self-pay | Admitting: Adult Health

## 2020-04-13 ENCOUNTER — Other Ambulatory Visit: Payer: Self-pay | Admitting: Adult Health

## 2020-05-02 ENCOUNTER — Other Ambulatory Visit: Payer: Self-pay | Admitting: Cardiovascular Disease

## 2020-05-02 NOTE — Telephone Encounter (Signed)
*  STAT* If patient is at the pharmacy, call can be transferred to refill team.   1. Which medications need to be refilled? (please list name of each medication and dose if known) rosuvastatin (CRESTOR) 40 MG tablet  2. Which pharmacy/location (including street and city if local pharmacy) is medication to be sent to? [id number] [place], [place] - [address]  3. Do they need a 30 day or 90 day supply? 90  Patient is out of medication. Has an appt with Joni Reining on 05/09/20.

## 2020-05-03 MED ORDER — ROSUVASTATIN CALCIUM 40 MG PO TABS: 40.0000 mg | ORAL_TABLET | Freq: Every day | ORAL | 0 refills | Status: DC

## 2020-05-08 NOTE — Progress Notes (Signed)
Cardiology Office Note   Date:  05/09/2020   ID:  [name], Jonathan November 04, 1961, MRN [id number]  PCP:  Salley Scarlet, MD  Cardiologist: Dr. Tresa Endo CC: Follow Up    History of Present Illness: Jonathan [name] is a 59 y.o. male who presents for ongoing assessment and management of coronary artery disease with history of totally occluded circumflex marginal status post Vision drug-eluting stent placed, with repeat cardiac catheterization in 2012 which revealed stent was widely patent.  He was also found to have some narrowing of the LAD.  He has other history of hyperlipidemia and hypertension.  The patient was taken off of ACE inhibitor in the setting of hyperkalemia he remained on Crestor for control of hypercholesterolemia. Repeat nuclear medicine study in September 2018, remain low risk it showed only a very small region of scar in the inferior lateral wall at the base. Normal LV systolic function 57%.   He remains on dual antiplatelet therapy with aspirin and Plavix. The patient is very active and walks 5 days a week. He was last seen by Dr. Tresa Endo November 2018 was doing well.  Follow-up labs included lipids and LFTs with a LDL calculated at 89 and total cholesterol 164.  He was started on Zetia 10 mg daily in addition to his statin therapy. On last encounter, he was seen via telemedicine on 04/05/2020.   Mr. [name] comes today without complaints.  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  He works 12-hour shifts with days off during the week.  He denies any chest pain dizziness, dyspnea on exertion, or changes in his energy level.  He is medically compliant.  He requires several refills today.   Past Medical History:  Diagnosis Date  . Abnormal stress test 2011   SEHV-EF 59%, Moderate perfusion defect due to scar, mild  periinfarct  . Coronary artery disease   . Echocardiogram 2011   mild posterior wall hypokinesis  . History of stress test 07/16/2012   abnormal myocardial perfusion study. No significant change from 08/03/2010 study, low risk scan.  [name] Hx of echocardiogram 12/14/2010  . Hyperlipidemia   . Hypertension   . Myocardial infarction [facility]) nov 2010   Jonathan [name]  . Peripheral vascular disease (HCC)   . Simvastatin-induced rhabdomyolysis   . Substance abuse (HCC)    > 20 years ago, marijauna, cocaine, hash    Past Surgical History:  Procedure Laterality Date  . CARDIAC CATHETERIZATION  12/14/2010   By Dr [name] showed a widely patent circumflex stent with mild plaque in his LAD.  [name] CORONARY STENT PLACEMENT  10/06/2009   Successful percutaneous coronary intervention of the circumflex marginal-2 vessel with percutaneous transluminal coronary angioplasty, stenting with a 2.75 x 18 mm non-drug eluting stent Vision Stent post-dilated to 3.0 mm.     Current Outpatient Medications  Medication Sig Dispense Refill  . Ascorbic Acid (VITAMIN C) 1000 MG tablet Take 1,000 mg by mouth 2 (two) times daily.      [name] aspirin EC 81 MG tablet Take 81 mg by mouth daily.      . B Complex-C (SUPER B COMPLEX PO) Take 1 tablet by mouth daily.      . clopidogrel (PLAVIX) 75 MG tablet Take 1 tablet (75 mg total) by mouth daily. 90 tablet 3  . clotrimazole-betamethasone (LOTRISONE) cream APPLY TO AFFECTED AREA  TWICE DAILY 45 g 0  . Coenzyme Q10 (CO Q 10) 100 MG CAPS Take 1 capsule by mouth daily.      [name] ezetimibe (ZETIA) 10 MG tablet Take 1 tablet (10 mg total) by mouth daily. 90 tablet 3  . Ginger, Zingiber officinalis, 550 MG CAPS Take 1 capsule by mouth daily.      . Magnesium 250 MG TABS Take 1 tablet by mouth daily.      . Multiple Vitamin (MULITIVITAMIN WITH MINERALS) TABS Take 1 tablet by mouth daily.      . nebivolol (BYSTOLIC) 5 MG tablet Take 1 tablet (5 mg total) by mouth daily. 90 tablet 3  . niacin  (NIASPAN) 500 MG CR tablet Take 1 tablet (500 mg total) by mouth at bedtime. 90 tablet 3  . Omega-3 Fatty Acids (FISH OIL) 1200 MG CAPS Take 1 capsule by mouth daily.      . rosuvastatin (CRESTOR) 40 MG tablet Take 1 tablet (40 mg total) by mouth daily. KEEP OV. 90 tablet 3  . Saw Palmetto, Serenoa repens, (SAW PALMETTO BERRIES PO) Take 1 tablet by mouth daily.      . sildenafil (VIAGRA) 50 MG tablet Take 50-100 mg by mouth daily as needed for erectile dysfunction.    . valsartan (DIOVAN) 160 MG tablet Take 1 tablet (160 mg total) by mouth daily. 90 tablet 3  . vitamin E 400 UNIT capsule Take 400 Units by mouth daily.      . Zinc 50 MG TABS Take 1 tablet by mouth daily.       No current facility-administered medications for this visit.    Allergies:   Other and Pantoprazole    Social History:  The patient  reports that he quit smoking about 13 years ago. He has never used smokeless tobacco. He reports current alcohol use of about 7.0 - 10.0 standard drinks of alcohol per week. He reports that he does not use drugs.   Family History:  The patient's family history includes Arthritis in his mother; Cancer in his father and maternal grandmother; Cancer - Lung in his maternal grandmother; Hyperlipidemia in his brother, father, mother, and sister; Hypertension in his brother, father, mother, and sister.    ROS: All other systems are reviewed and negative. Unless otherwise mentioned in H&P    PHYSICAL EXAM: VS:  BP (!) 146/82   Pulse (!) 58   Ht 5\' 10"  (1.778 m)   Wt 221 lb 9.6 oz (100.5 kg)   SpO2 98%   BMI 31.80 kg/m  , BMI Body mass index is 31.8 kg/m. GEN: Well nourished, well developed, in no acute distress HEENT: normal Neck: no JVD, carotid bruits, or masses Cardiac: RRR; soft systolic murmurs, rubs, or gallops,no edema  Respiratory:  Clear to auscultation bilaterally, normal work of breathing GI: soft, nontender, nondistended, + BS, mildly obese MS: no deformity or  atrophy Skin: warm and dry, no rash Neuro:  Strength and sensation are intact Psych: euthymic mood, full affect   EKG: Sinus bradycardia heart rate of 58 bpm  Recent Labs: No results found for requested labs within last 8760 hours.    Lipid Panel    Component Value Date/Time   CHOL 164 04/01/2018 1115   TRIG 110 04/01/2018 1115   HDL 53 04/01/2018 1115   CHOLHDL 3.1 04/01/2018 1115   CHOLHDL 2.5 02/12/2017 0950   VLDL 14 02/12/2017 0950   LDLCALC 89 04/01/2018 1115      Wt Readings from Last  3 Encounters:  05/09/20 221 lb 9.6 oz (100.5 kg)  04/06/19 220 lb (99.8 kg)  05/20/18 216 lb (98 kg)      Other studies Reviewed: NM Stress Test 11-Aug-2017 Study Highlights   The left ventricular ejection fraction is normal (55-65%).  Nuclear stress EF: 65%.  Blood pressure demonstrated a normal response to exercise.  There was no ST segment deviation noted during stress.  There is a medium defect of moderate severity present in the basal inferoseptal, basal inferior, mid inferoseptal and mid inferior location.In the setting of normal LVF this is consistent with diaphragmatic attenuation artifact. No ischemia noted.  This is a low risk study.     ASSESSMENT AND PLAN:  1. CAD: History of totally occluded circumflex marginal status post vision drug-eluting stent, with repeat cardiac catheterization in 2012 revealing stent was widely patent.  He did have some narrowing of his LAD.  He denies any chest pain, or cardiac symptoms of angina.  He is medically compliant.  He will continue aspirin and Plavix, statin therapy, and ARB.  2.  Hypertension: Blood pressure is elevated today compared to prior office visits with other providers his blood pressure had been elevated as well.  As result of this I will increase his valsartan to 160 mg daily from 80 mg daily for better blood pressure control in the setting of CAD with goal of blood pressure 120-130 systolic.  He is to keep track  of his blood pressure at home and report any significantly low blood pressures less than 110 systolic.  I will check CMP T today and CBC  3.  Hypercholesterolemia: Goal of LDL less than 70.  He continues on Zetia and rosuvastatin refills are provided.  Checking lipids and LFTs today to evaluate his current status.    Current medicines are reviewed at length with the patient today.  I have spent 25 minutes dedicated to the care of this patient on the date of this encounter to include pre-visit review of records, assessment, management and diagnostic testing,with shared decision making.  Labs/ tests ordered today include: CMET, Lipids, CBC.   Bettey Mare. Liborio Nixon, ANP, [facility]   05/09/2020 8:58 AM    [facility] [address] Office [phone] Fax [phone]  Notice: This dictation was prepared with Dragon dictation along with smaller phrase technology. Any transcriptional errors that result from this process are unintentional and may not be corrected upon review.

## 2020-05-09 ENCOUNTER — Encounter: Payer: Self-pay | Admitting: Adult Health

## 2020-05-09 ENCOUNTER — Other Ambulatory Visit: Payer: Self-pay | Admitting: Adult Health

## 2020-05-09 ENCOUNTER — Ambulatory Visit (INDEPENDENT_AMBULATORY_CARE_PROVIDER_SITE_OTHER): Payer: BC Managed Care – PPO | Admitting: Adult Health

## 2020-05-09 ENCOUNTER — Other Ambulatory Visit: Payer: Self-pay

## 2020-05-09 VITALS — BP 146/82 | HR 58 | Ht 70.0 in | Wt 221.6 lb

## 2020-05-09 DIAGNOSIS — I10 Essential (primary) hypertension: Secondary | ICD-10-CM | POA: Diagnosis not present

## 2020-05-09 DIAGNOSIS — I2583 Coronary atherosclerosis due to lipid rich plaque: Secondary | ICD-10-CM | POA: Diagnosis not present

## 2020-05-09 DIAGNOSIS — E782 Mixed hyperlipidemia: Secondary | ICD-10-CM | POA: Diagnosis not present

## 2020-05-09 DIAGNOSIS — I2510 Atherosclerotic heart disease of native coronary artery without angina pectoris: Secondary | ICD-10-CM | POA: Diagnosis not present

## 2020-05-09 LAB — CBC
Hematocrit: 45.3 % (ref 37.5–51.0)
Hemoglobin: 15.4 g/dL (ref 13.0–17.7)
MCH: 30.5 pg (ref 26.6–33.0)
MCHC: 34 g/dL (ref 31.5–35.7)
MCV: 90 fL (ref 79–97)
Platelets: 269 10*3/uL (ref 150–450)
RBC: 5.05 x10E6/uL (ref 4.14–5.80)
RDW: 12.6 % (ref 11.6–15.4)
WBC: 5.6 10*3/uL (ref 3.4–10.8)

## 2020-05-09 LAB — COMPREHENSIVE METABOLIC PANEL
ALT: 24 IU/L (ref 0–44)
AST: 24 IU/L (ref 0–40)
Albumin/Globulin Ratio: 2.1 (ref 1.2–2.2)
Albumin: 4.5 g/dL (ref 3.8–4.8)
Alkaline Phosphatase: 76 IU/L (ref 48–121)
BUN/Creatinine Ratio: 7 — ABNORMAL LOW (ref 10–24)
BUN: 5 mg/dL — ABNORMAL LOW (ref 8–27)
Bilirubin Total: 0.5 mg/dL (ref 0.0–1.2)
CO2: 23 mmol/L (ref 20–29)
Calcium: 8.9 mg/dL (ref 8.6–10.2)
Chloride: 106 mmol/L (ref 96–106)
Creatinine, Ser: 0.71 mg/dL — ABNORMAL LOW (ref 0.76–1.27)
GFR calc Af Amer: 117 mL/min/{1.73_m2} (ref >59)
GFR calc non Af Amer: 101 mL/min/{1.73_m2} (ref >59)
Globulin, Total: 2.1 g/dL (ref 1.5–4.5)
Glucose: 91 mg/dL (ref 65–99)
Potassium: 5.3 mmol/L — ABNORMAL HIGH (ref 3.5–5.2)
Sodium: 141 mmol/L (ref 134–144)
Total Protein: 6.6 g/dL (ref 6.0–8.5)

## 2020-05-09 LAB — LIPID PANEL
Chol/HDL Ratio: 2.6 ratio (ref 0.0–5.0)
Cholesterol, Total: 111 mg/dL (ref 100–199)
HDL: 42 mg/dL (ref >39)
LDL Chol Calc (NIH): 36 mg/dL (ref 0–99)
Triglycerides: 211 mg/dL — ABNORMAL HIGH (ref 0–149)
VLDL Cholesterol Cal: 33 mg/dL (ref 5–40)

## 2020-05-09 MED ORDER — CLOPIDOGREL BISULFATE 75 MG PO TABS: 75.0000 mg | ORAL_TABLET | Freq: Every day | ORAL | 3 refills | Status: DC

## 2020-05-09 MED ORDER — ROSUVASTATIN CALCIUM 40 MG PO TABS: 40.0000 mg | ORAL_TABLET | Freq: Every day | ORAL | 3 refills | Status: DC

## 2020-05-09 MED ORDER — NEBIVOLOL HCL 5 MG PO TABS: 5.0000 mg | ORAL_TABLET | Freq: Every day | ORAL | 3 refills | Status: AC

## 2020-05-09 MED ORDER — EZETIMIBE 10 MG PO TABS: 10.0000 mg | ORAL_TABLET | Freq: Every day | ORAL | 3 refills | Status: DC

## 2020-05-09 MED ORDER — VALSARTAN 160 MG PO TABS: 160.0000 mg | ORAL_TABLET | Freq: Every day | ORAL | 3 refills | Status: DC

## 2020-05-09 NOTE — Patient Instructions (Signed)
Medication Instructions:  INCREASE- Valsartan 160 mg by mouth daily  *If you need a refill on your cardiac medications before your next appointment, please call your pharmacy*   Lab Work: None Ordered  Testing/Procedures: None Ordered   Follow-Up: At [facility], you and your health needs are our priority.  As part of our continuing mission to provide you with exceptional heart care, we have created designated Provider Care Teams.  These Care Teams include your primary Cardiologist (physician) and Advanced Practice Providers (APPs -  Physician Assistants and Nurse Practitioners) who all work together to provide you with the care you need, when you need it.  We recommend signing up for the patient portal called "MyChart".  Sign up information is provided on this After Visit Summary.  MyChart is used to connect with patients for Virtual Visits (Telemedicine).  Patients are able to view lab/test results, encounter notes, upcoming appointments, etc.  Non-urgent messages can be sent to your provider as well.   To learn more about what you can do with MyChart, go to [email or web address].    Your next appointment:   6 month(s)  The format for your next appointment:   In Person  Provider:   You may see Nicki Guadalajara, MD or one of the following Advanced Practice Providers on your designated Care Team:    Azalee Course, PA-C  Micah Flesher, PA-C or   Judy Pimple, [place]

## 2020-05-19 ENCOUNTER — Other Ambulatory Visit (INDEPENDENT_AMBULATORY_CARE_PROVIDER_SITE_OTHER): Payer: BC Managed Care – PPO

## 2020-05-19 DIAGNOSIS — I10 Essential (primary) hypertension: Secondary | ICD-10-CM

## 2020-08-09 ENCOUNTER — Ambulatory Visit (INDEPENDENT_AMBULATORY_CARE_PROVIDER_SITE_OTHER): Payer: BC Managed Care – PPO | Admitting: Family Medicine

## 2020-08-09 ENCOUNTER — Encounter: Payer: Self-pay | Admitting: Family Medicine

## 2020-08-09 ENCOUNTER — Encounter: Payer: 59 | Admitting: Family Medicine

## 2020-08-09 ENCOUNTER — Other Ambulatory Visit: Payer: Self-pay

## 2020-08-09 VITALS — BP 138/78 | HR 70 | Temp 97.6°F | Resp 16 | Ht 70.0 in | Wt 222.0 lb

## 2020-08-09 DIAGNOSIS — Z0000 Encounter for general adult medical examination without abnormal findings: Secondary | ICD-10-CM

## 2020-08-09 DIAGNOSIS — I2510 Atherosclerotic heart disease of native coronary artery without angina pectoris: Secondary | ICD-10-CM

## 2020-08-09 DIAGNOSIS — M791 Myalgia, unspecified site: Secondary | ICD-10-CM

## 2020-08-09 DIAGNOSIS — Z125 Encounter for screening for malignant neoplasm of prostate: Secondary | ICD-10-CM

## 2020-08-09 DIAGNOSIS — I10 Essential (primary) hypertension: Secondary | ICD-10-CM | POA: Diagnosis not present

## 2020-08-09 DIAGNOSIS — Z0001 Encounter for general adult medical examination with abnormal findings: Secondary | ICD-10-CM | POA: Diagnosis not present

## 2020-08-09 DIAGNOSIS — I2583 Coronary atherosclerosis due to lipid rich plaque: Secondary | ICD-10-CM

## 2020-08-09 DIAGNOSIS — Z0184 Encounter for antibody response examination: Secondary | ICD-10-CM

## 2020-08-09 DIAGNOSIS — E782 Mixed hyperlipidemia: Secondary | ICD-10-CM

## 2020-08-09 DIAGNOSIS — E559 Vitamin D deficiency, unspecified: Secondary | ICD-10-CM

## 2020-08-09 DIAGNOSIS — N529 Male erectile dysfunction, unspecified: Secondary | ICD-10-CM

## 2020-08-09 DIAGNOSIS — E669 Obesity, unspecified: Secondary | ICD-10-CM

## 2020-08-09 NOTE — Assessment & Plan Note (Signed)
CPE done, fasting labs obtained Check CK for myalgias if elevated would hold Crestor, consider Repatha or Praulent with his CAD

## 2020-08-09 NOTE — Assessment & Plan Note (Signed)
Referral to urology to discuss penile implant

## 2020-08-09 NOTE — Progress Notes (Signed)
Subjective:    Patient ID: Jonathan [name], male    DOB: 1961-04-10, 59 y.o.   MRN: [id number]  Patient presents for Annual Exam (is fasting)   Pt here for CPE  medications rewviewed    CAD- recenty seen by cardiolgy Valsartan increased to 160mg  due to uncontrolled BP, continued on bystolic   Zetia and Crestor   he had elevated TG on lipids in June, needs to be repeated He is also worried about myalgias from his statin drug. He has had LE myalgias and occ in arms for a year or more, wanted CK level checked, he does take Coenzyme Q 10     Immunization- declines flu shot, covivd-19 shot- discussed his concerns about vaccination, he request antibody testing    BPH- due for PSA, urinating okay / ED he is still considering implant as the PDE do not work  he wouldlike anew referral    he is using prostate complete vitamin with [employer] dogs for Physical Activity / mows yard, works outside       Review Of Systems:  GEN- denies fatigue, fever, weight loss,weakness, recent illness HEENT- denies eye drainage, change in vision, nasal discharge, CVS- denies chest pain, palpitations RESP- denies SOB, cough, wheeze ABD- denies N/V, change in stools, abd pain GU- denies dysuria, hematuria, dribbling, incontinence MSK- denies joint pain, +muscle aches, injury Neuro- denies headache, dizziness, syncope, seizure activity       Objective:    BP 138/78    Pulse 70    Temp 97.6 F (36.4 C) (Temporal)    Resp 16    Ht 5\' 10"  (1.778 m)    Wt 222 lb (100.7 kg)    SpO2 93%    BMI 31.85 kg/m  GEN- NAD, alert and oriented x3 HEENT- PERRL, EOMI, non injected sclera, pink conjunctiva,TM clear no effusion, nares clear  Neck- Supple, no thyromegaly , no bruit   CVS- RRR, no murmur RESP-CTAB ABD-NABS,soft,NT,ND Psych normal affect and mood  EXT- No edema MSK FROM upper and LE, LE Non tender  Pulses- Radial, DP- 2+  FALL/AUDIT C/DEpression  screen neg      Assessment &  Plan:      Problem List Items Addressed This Visit      Unprioritized   CAD (coronary artery disease)    Bp improved, no changes to meds       Relevant Orders   Lipid panel   Erectile dysfunction    Referral to urology to discuss penile implant      Relevant Orders   Ambulatory referral to Urology   Essential hypertension, benign    Continue current meds, bp improved       Relevant Orders   Comprehensive metabolic panel   Hyperlipidemia   Obesity (BMI 30.0-34.9)    Check A1C       Relevant Orders   Hemoglobin A1c   Routine general medical examination at a health care facility - Primary    CPE done, fasting labs obtained Check CK for myalgias if elevated would hold Crestor, consider Repatha or Praulent with his CAD         Other Visit Diagnoses    Myalgia       Relevant Orders   CK   Vitamin D deficiency       Relevant Orders   Vitamin D, 25-hydroxy   Prostate cancer screening       Relevant Orders   PSA  Immunity status testing       Relevant Orders   SAR CoV2 Serology (COVID 19)AB(IGG)IA      Note: This dictation was prepared with Dragon dictation along with smaller phrase technology. Any transcriptional errors that result from this process are unintentional.

## 2020-08-09 NOTE — Patient Instructions (Signed)
Referral to urology F/U 1 year for Physical

## 2020-08-09 NOTE — Assessment & Plan Note (Signed)
Bp improved, no changes to meds

## 2020-08-09 NOTE — Assessment & Plan Note (Signed)
Continue current meds, bp improved

## 2020-08-09 NOTE — Assessment & Plan Note (Signed)
Check A1C 

## 2020-08-10 ENCOUNTER — Other Ambulatory Visit: Payer: Self-pay | Admitting: *Deleted

## 2020-08-10 DIAGNOSIS — E875 Hyperkalemia: Secondary | ICD-10-CM

## 2020-08-10 LAB — VITAMIN D 25 HYDROXY (VIT D DEFICIENCY, FRACTURES): Vit D, 25-Hydroxy: 69 ng/mL (ref 30–100)

## 2020-08-10 LAB — PSA: PSA: 3.52 ng/mL (ref <=4.0)

## 2020-08-10 LAB — COMPREHENSIVE METABOLIC PANEL
AG Ratio: 2.1 (calc) (ref 1.0–2.5)
ALT: 38 U/L (ref 9–46)
AST: 34 U/L (ref 10–35)
Albumin: 4.4 g/dL (ref 3.6–5.1)
Alkaline phosphatase (APISO): 71 U/L (ref 35–144)
BUN: 14 mg/dL (ref 7–25)
CO2: 22 mmol/L (ref 20–32)
Calcium: 9.7 mg/dL (ref 8.6–10.3)
Chloride: 104 mmol/L (ref 98–110)
Creat: 0.8 mg/dL (ref 0.70–1.25)
Globulin: 2.1 g/dL (calc) (ref 1.9–3.7)
Glucose, Bld: 85 mg/dL (ref 65–99)
Potassium: 5.5 mmol/L — ABNORMAL HIGH (ref 3.5–5.3)
Sodium: 139 mmol/L (ref 135–146)
Total Bilirubin: 0.7 mg/dL (ref 0.2–1.2)
Total Protein: 6.5 g/dL (ref 6.1–8.1)

## 2020-08-10 LAB — CK: Total CK: 79 U/L (ref 44–196)

## 2020-08-10 LAB — HEMOGLOBIN A1C
Hgb A1c MFr Bld: 5.4 % of total Hgb (ref <5.7)
Mean Plasma Glucose: 108 (calc)
eAG (mmol/L): 6 (calc)

## 2020-08-10 LAB — LIPID PANEL
Cholesterol: 108 mg/dL (ref <200)
HDL: 38 mg/dL — ABNORMAL LOW (ref >=40)
LDL Cholesterol (Calc): 36 mg/dL (calc)
Non-HDL Cholesterol (Calc): 70 mg/dL (calc) (ref <130)
Total CHOL/HDL Ratio: 2.8 (calc) (ref <5.0)
Triglycerides: 319 mg/dL — ABNORMAL HIGH (ref <150)

## 2020-08-10 LAB — SARS-COV-2 ANTIBODY(IGG)SPIKE,SEMI-QUANTITATIVE: SARS COV1 AB(IGG)SPIKE,SEMI QN: <1 index (ref <1.00)

## 2020-08-10 MED ORDER — SODIUM POLYSTYRENE SULFONATE PO POWD: ORAL | 0 refills | Status: DC

## 2020-08-10 MED ORDER — OMEGA-3-ACID ETHYL ESTERS 1 G PO CAPS: 2.0000 g | ORAL_CAPSULE | Freq: Two times a day (BID) | ORAL | 3 refills | Status: DC

## 2020-08-11 ENCOUNTER — Telehealth: Payer: Self-pay | Admitting: *Deleted

## 2020-08-11 MED ORDER — OMEGA-3-ACID ETHYL ESTERS 1 G PO CAPS: 2.0000 g | ORAL_CAPSULE | Freq: Two times a day (BID) | ORAL | 3 refills | Status: DC

## 2020-08-11 NOTE — Telephone Encounter (Signed)
Received request from pharmacy for PA on Lovaza.  PA submitted.   Dx: E78.5- HLD.  Received immediate determination.   Case ID [id number] approved 07/12/2020- 08/11/2023.  Pharmacy made aware.

## 2020-08-25 ENCOUNTER — Other Ambulatory Visit: Payer: BC Managed Care – PPO

## 2020-08-25 ENCOUNTER — Other Ambulatory Visit: Payer: Self-pay

## 2020-08-25 DIAGNOSIS — E875 Hyperkalemia: Secondary | ICD-10-CM

## 2020-08-25 LAB — BASIC METABOLIC PANEL
BUN: 11 mg/dL (ref 7–25)
CO2: 28 mmol/L (ref 20–32)
Calcium: 9.6 mg/dL (ref 8.6–10.3)
Chloride: 105 mmol/L (ref 98–110)
Creat: 0.81 mg/dL (ref 0.70–1.25)
Glucose, Bld: 84 mg/dL (ref 65–99)
Potassium: 4.6 mmol/L (ref 3.5–5.3)
Sodium: 141 mmol/L (ref 135–146)

## 2020-09-21 ENCOUNTER — Ambulatory Visit (INDEPENDENT_AMBULATORY_CARE_PROVIDER_SITE_OTHER): Payer: BC Managed Care – PPO | Admitting: Urology

## 2020-09-21 ENCOUNTER — Encounter: Payer: Self-pay | Admitting: Urology

## 2020-09-21 ENCOUNTER — Other Ambulatory Visit: Payer: Self-pay

## 2020-09-21 VITALS — BP 138/71 | HR 72 | Temp 97.9°F | Ht 70.0 in | Wt 222.0 lb

## 2020-09-21 DIAGNOSIS — N529 Male erectile dysfunction, unspecified: Secondary | ICD-10-CM | POA: Diagnosis not present

## 2020-09-21 MED ORDER — AMBULATORY NON FORMULARY MEDICATION: 0.2000 mL | 5 refills | Status: DC | PRN

## 2020-09-21 NOTE — Progress Notes (Signed)
09/21/2020 11:10 AM   Jonathan [name] [birth year] [id number]  Referring provider: Salley Scarlet, MD [address] [place],  [place] [address]  Erectile dysfunction  HPI: Mr Dowell is a 59yo here for evaluation of erectile dysfunction. The patient previously saw Dr. Retta Diones for ED and has tried viagra, cialis, edex without good results. He has a hx of MI in 2013 and is currently on plavix. He is able to stop plavix for surgery. He has issues getting and maintaining an erection.   His records from [facility] are as follows: This 59 year old man was originally sent by Dr.Camargo in April, 2014 for evaluation and management of erectile dysfunction. He has a history of coronary artery disease, having had an MI in approximately 2010. For a few years prior to that, he had decreased tumescence, and early detumescence. He denies any curvature pain with erections. He has a normal libido.    He has used Viagra and Cialis. He would prefer Viagra, but he is getting samples of Cialis from his primary care.   His PSA in March 2016 was 2.78. It was 3.3 approximately 1 year ago.    Interval history:   Since his visit here last year, Aquiles has had stable but slightly bothersome urinary symptomatology. He has had no lower than his urine. He has had no infections. His EGD is successfully managed by 40 mg of sildenafil.     PMH: Past Medical History:  Diagnosis Date   Abnormal stress test 2011   SEHV-EF 59%, Moderate perfusion defect due to scar, mild periinfarct   Anxiety    Coronary artery disease    Echocardiogram 2011   mild posterior wall hypokinesis   GERD (gastroesophageal reflux disease)    Heart disease    Heart murmur    History of stress test 07/16/2012   abnormal myocardial perfusion study. No significant change from 08/03/2010 study, low risk scan.   Hx of echocardiogram 12/14/2010   Hyperlipidemia    Hypertension    Myocardial infarction [facility]) nov 2010    [name]   Peripheral vascular disease (HCC)    Simvastatin-induced rhabdomyolysis    Substance abuse (HCC)    > 20 years ago, marijauna, cocaine, hash    Surgical History: Past Surgical History:  Procedure Laterality Date   CARDIAC CATHETERIZATION  12/14/2010   By Dr [name] showed a widely patent circumflex stent with mild plaque in his LAD.   CORONARY STENT PLACEMENT  10/06/2009   Successful percutaneous coronary intervention of the circumflex marginal-2 vessel with percutaneous transluminal coronary angioplasty, stenting with a 2.75 x 18 mm non-drug eluting stent Vision Stent post-dilated to 3.0 mm.    Home Medications:  Allergies as of 09/21/2020      Reactions   Other    apples   Pantoprazole Other (See Comments)   More stomach problems      Medication List       Accurate as of September 21, 2020 11:10 AM. If you have any questions, ask your nurse or doctor.        aspirin EC 81 MG tablet Take 81 mg by mouth daily.   clopidogrel 75 MG tablet Commonly known as: PLAVIX Take 1 tablet (75 mg total) by mouth daily.   clotrimazole-betamethasone cream Commonly known as: LOTRISONE APPLY TO AFFECTED AREA TWICE DAILY   Co Q 10 100 MG Caps Take 1 capsule by mouth daily.   ezetimibe 10 MG tablet Commonly known as: ZETIA Take 1  tablet (10 mg total) by mouth daily.   Ginger (Zingiber officinalis) 550 MG Caps Take 1 capsule by mouth daily.   Magnesium 250 MG Tabs Take 1 tablet by mouth daily.   multivitamin with minerals Tabs tablet Take 1 tablet by mouth daily.   nebivolol 5 MG tablet Commonly known as: BYSTOLIC Take 1 tablet (5 mg total) by mouth daily.   niacin 500 MG CR tablet Commonly known as: NIASPAN Take 1 tablet (500 mg total) by mouth at bedtime.   omega-3 acid ethyl esters 1 g capsule Commonly known as: Lovaza Take 2 capsules (2 g total) by mouth 2 (two) times daily.   rosuvastatin 40 MG tablet Commonly known as: CRESTOR Take 1 tablet  (40 mg total) by mouth daily. KEEP OV.   SAW PALMETTO BERRIES PO Take 1 tablet by mouth daily.   sildenafil 50 MG tablet Commonly known as: VIAGRA Take 50-100 mg by mouth daily as needed for erectile dysfunction.   sodium polystyrene powder Commonly known as: KAYEXALATE Take 30g PO x1 dose.   SUPER B COMPLEX PO Take 1 tablet by mouth daily.   valsartan 160 MG tablet Commonly known as: DIOVAN Take 1 tablet (160 mg total) by mouth daily.   vitamin C 1000 MG tablet Take 1,000 mg by mouth 2 (two) times daily.   vitamin E 180 MG (400 UNITS) capsule Take 400 Units by mouth daily.   Zinc 50 MG Tabs Take 1 tablet by mouth daily.       Allergies:  Allergies  Allergen Reactions   Other     apples   Pantoprazole Other (See Comments)    More stomach problems    Family History: Family History  Problem Relation Age of Onset   Hypertension Mother    Hyperlipidemia Mother    Arthritis Mother    Hypertension Father    Hyperlipidemia Father    Cancer Father        kidney- METASTATIC   Hypertension Brother    Hyperlipidemia Brother    Hypertension Sister    Hyperlipidemia Sister    Cancer - Lung Maternal Grandmother    Cancer Maternal Grandmother        breast    Social History:  reports that he quit smoking about 13 years ago. His smoking use included cigarettes. He has a 76.00 pack-year smoking history. He quit smokeless tobacco use about 13 years ago. He reports current alcohol use of about 7.0 - 10.0 standard drinks of alcohol per week. He reports that he does not use drugs.  ROS: All other review of systems were reviewed and are negative except what is noted above in HPI  Physical Exam: BP 138/71    Pulse 72    Temp 97.9 F (36.6 C)    Ht 5\' 10"  (1.778 m)    Wt 222 lb (100.7 kg)    BMI 31.85 kg/m   Constitutional:  Alert and oriented, No acute distress. HEENT: [place] AT, moist mucus membranes.  Trachea midline, no masses. Cardiovascular: No clubbing,  cyanosis, or edema. Respiratory: Normal respiratory effort, no increased work of breathing. GI: Abdomen is soft, nontender, nondistended, no abdominal masses GU: No CVA tenderness.  Circumcised phallus. No masses/lesions on penis, testes, scrotum Lymph: No cervical or inguinal lymphadenopathy. Skin: No rashes, bruises or suspicious lesions. Neurologic: Grossly intact, no focal deficits, moving all 4 extremities. Psychiatric: Normal mood and affect.  Laboratory Data: Lab Results  Component Value Date   WBC 5.6 05/09/2020  HGB 15.4 05/09/2020   HCT 45.3 05/09/2020   MCV 90 05/09/2020   PLT 269 05/09/2020    Lab Results  Component Value Date   CREATININE 0.81 08/25/2020    Lab Results  Component Value Date   PSA 3.52 08/09/2020   PSA 3.0 05/20/2018   PSA 3.0 02/12/2017    Lab Results  Component Value Date   TESTOSTERONE 234 (L) 02/12/2017    Lab Results  Component Value Date   HGBA1C 5.4 08/09/2020    Urinalysis    Component Value Date/Time   BILIRUBINUR negative 07/28/2012 1357   PROTEINUR negative 07/28/2012 1357   UROBILINOGEN 0.2 07/28/2012 1357   NITRITE negative 07/28/2012 1357   LEUKOCYTESUR Negative 07/28/2012 1357    No results found for: LABMICR, WBCUA, RBCUA, LABEPIT, MUCUS, BACTERIA  Pertinent Imaging:  No results found for this or any previous visit.  No results found for this or any previous visit.  No results found for this or any previous visit.  No results found for this or any previous visit.  No results found for this or any previous visit.  No results found for this or any previous visit.  No results found for this or any previous visit.  No results found for this or any previous visit.   Assessment & Plan:    1. Erectile dysfunction, unspecified erectile dysfunction type -We discussed the management including PDE5s, ICI, VED, muse, and IPP. I showed him his stretched penile length for the IPP and he was satisfied with the  length if he was to have an IPP placed.  - Urinalysis, Routine w reflex microscopic   No follow-ups on file.  Wilkie Aye, MD  [facility] [place]

## 2020-09-21 NOTE — Patient Instructions (Signed)
Erectile Dysfunction Erectile dysfunction (ED) is the inability to get or keep an erection in order to have sexual intercourse. Erectile dysfunction may include:  Inability to get an erection.  Lack of enough hardness of the erection to allow penetration.  Loss of the erection before sex is finished. What are the causes? This condition may be caused by:  Certain medicines, such as: ? Pain relievers. ? Antihistamines. ? Antidepressants. ? Blood pressure medicines. ? Water pills (diuretics). ? Ulcer medicines. ? Muscle relaxants. ? Drugs.  Excessive drinking.  Psychological causes, such as: ? Anxiety. ? Depression. ? Sadness. ? Exhaustion. ? Performance fear. ? Stress.  Physical causes, such as: ? Artery problems. This may include diabetes, smoking, liver disease, or atherosclerosis. ? High blood pressure. ? Hormonal problems, such as low testosterone. ? Obesity. ? Nerve problems. This may include back or pelvic injuries, diabetes mellitus, multiple sclerosis, or Parkinson disease. What are the signs or symptoms? Symptoms of this condition include:  Inability to get an erection.  Lack of enough hardness of the erection to allow penetration.  Loss of the erection before sex is finished.  Normal erections at some times, but with frequent unsatisfactory episodes.  Low sexual satisfaction in either partner due to erection problems.  A curved penis occurring with erection. The curve may cause pain or the penis may be too curved to allow for intercourse.  Never having nighttime erections. How is this diagnosed? This condition is often diagnosed by:  Performing a physical exam to find other diseases or specific problems with the penis.  Asking you detailed questions about the problem.  Performing blood tests to check for diabetes mellitus or to measure hormone levels.  Performing other tests to check for underlying health conditions.  Performing an ultrasound  exam to check for scarring.  Performing a test to check blood flow to the penis.  Doing a sleep study at home to measure nighttime erections. How is this treated? This condition may be treated by:  Medicine taken by mouth to help you achieve an erection (oral medicine).  Hormone replacement therapy to replace low testosterone levels.  Medicine that is injected into the penis. Your health care provider may instruct you how to give yourself these injections at home.  Vacuum pump. This is a pump with a ring on it. The pump and ring are placed on the penis and used to create pressure that helps the penis become erect.  Penile implant surgery. In this procedure, you may receive: ? An inflatable implant. This consists of cylinders, a pump, and a reservoir. The cylinders can be inflated with a fluid that helps to create an erection, and they can be deflated after intercourse. ? A semi-rigid implant. This consists of two silicone rubber rods. The rods provide some rigidity. They are also flexible, so the penis can both curve downward in its normal position and become straight for sexual intercourse.  Blood vessel surgery, to improve blood flow to the penis. During this procedure, a blood vessel from a different part of the body is placed into the penis to allow blood to flow around (bypass) damaged or blocked blood vessels.  Lifestyle changes, such as exercising more, losing weight, and quitting smoking. Follow these instructions at home: Medicines   Take over-the-counter and prescription medicines only as told by your health care provider. Do not increase the dosage without first discussing it with your health care provider.  If you are using self-injections, perform injections as directed by your   health care provider. Make sure to avoid any veins that are on the surface of the penis. After giving an injection, apply pressure to the injection site for 5 minutes. General  instructions  Exercise regularly, as directed by your health care provider. Work with your health care provider to lose weight, if needed.  Do not use any products that contain nicotine or tobacco, such as cigarettes and e-cigarettes. If you need help quitting, ask your health care provider.  Before using a vacuum pump, read the instructions that come with the pump and discuss any questions with your health care provider.  Keep all follow-up visits as told by your health care provider. This is important. Contact a health care provider if:  You feel nauseous.  You vomit. Get help right away if:  You are taking oral or injectable medicines and you have an erection that lasts longer than 4 hours. If your health care provider is unavailable, go to the nearest emergency room for evaluation. An erection that lasts much longer than 4 hours can result in permanent damage to your penis.  You have severe pain in your groin or abdomen.  You develop redness or severe swelling of your penis.  You have redness spreading up into your groin or lower abdomen.  You are unable to urinate.  You experience chest pain or a rapid heart beat (palpitations) after taking oral medicines. Summary  Erectile dysfunction (ED) is the inability to get or keep an erection during sexual intercourse. This problem can usually be treated successfully.  This condition is diagnosed based on a physical exam, your symptoms, and tests to determine the cause. Treatment varies depending on the cause, and may include medicines, hormone therapy, surgery, or vacuum pump.  You may need follow-up visits to make sure that you are using your medicines or devices correctly.  Get help right away if you are taking or injecting medicines and you have an erection that lasts longer than 4 hours. This information is not intended to replace advice given to you by your health care provider. Make sure you discuss any questions you have with  your health care provider. Document Revised: 10/17/2017 Document Reviewed: 11/20/2016 Elsevier Patient Education  2020 [employer]  

## 2020-09-21 NOTE — Progress Notes (Signed)
Urological Symptom Review  Patient is experiencing the following symptoms: Get up at night to urinate Stream starts and stops Have to strain to urinate Erection problems (male only)   Review of Systems  Gastrointestinal (upper)  : Indigestion/heartburn  Gastrointestinal (lower) : Negative for lower GI symptoms  Constitutional : Negative for symptoms  Skin: Negative for skin symptoms  Eyes: Negative for eye symptoms  Ear/Nose/Throat : Negative for Ear/Nose/Throat symptoms  Hematologic/Lymphatic: Easy bruising  Cardiovascular : Negative for cardiovascular symptoms  Respiratory : Negative for respiratory symptoms  Endocrine: Negative for endocrine symptoms  Musculoskeletal: Back pain  Neurological: Negative for neurological symptoms  Psychologic: Negative for psychiatric symptoms

## 2020-10-05 ENCOUNTER — Ambulatory Visit (INDEPENDENT_AMBULATORY_CARE_PROVIDER_SITE_OTHER): Payer: BC Managed Care – PPO | Admitting: Urology

## 2020-10-05 ENCOUNTER — Encounter: Payer: Self-pay | Admitting: Urology

## 2020-10-05 ENCOUNTER — Other Ambulatory Visit: Payer: Self-pay

## 2020-10-05 VITALS — BP 138/78 | HR 60 | Temp 98.5°F | Ht 70.0 in | Wt 222.0 lb

## 2020-10-05 DIAGNOSIS — N5201 Erectile dysfunction due to arterial insufficiency: Secondary | ICD-10-CM

## 2020-10-05 LAB — URINALYSIS, ROUTINE W REFLEX MICROSCOPIC
Bilirubin, UA: NEGATIVE
Glucose, UA: NEGATIVE
Ketones, UA: NEGATIVE
Nitrite, UA: NEGATIVE
Protein,UA: NEGATIVE
RBC, UA: NEGATIVE
Specific Gravity, UA: 1.015 (ref 1.005–1.030)
Urobilinogen, Ur: 0.2 mg/dL (ref 0.2–1.0)
pH, UA: 6 (ref 5.0–7.5)

## 2020-10-05 LAB — MICROSCOPIC EXAMINATION
Bacteria, UA: NONE SEEN
Epithelial Cells (non renal): NONE SEEN /hpf (ref 0–10)
Renal Epithel, UA: NONE SEEN /hpf

## 2020-10-05 NOTE — Progress Notes (Signed)
Urological Symptom Review  Patient is experiencing the following symptoms: Frequent urination Get up at night to urinate Stream starts and stops Weak stream Erection problems (male only)   Review of Systems  Gastrointestinal (upper)  : Negative for upper GI symptoms  Gastrointestinal (lower) : Negative for lower GI symptoms  Constitutional : Negative for symptoms  Skin: Negative for skin symptoms  Eyes: Negative for eye symptoms  Ear/Nose/Throat : Negative for Ear/Nose/Throat symptoms  Hematologic/Lymphatic: Negative for Hematologic/Lymphatic symptoms  Cardiovascular : Negative for cardiovascular symptoms  Respiratory : Negative for respiratory symptoms  Endocrine: Negative for endocrine symptoms  Musculoskeletal: Negative for musculoskeletal symptoms  Neurological: Negative for neurological symptoms  Psychologic: Negative for psychiatric symptoms

## 2020-10-05 NOTE — Progress Notes (Signed)
10/05/2020 9:37 AM   Jonathan [name] Apr 30, 1961 [id number]  Referring provider: Salley Scarlet, MD [address] [place],  [place] [address]  Erectile dysfucntion  HPI: [name] is a 59yo here for followup for erectile dysfunction. He is here for trimix teaching   PMH: Past Medical History:  Diagnosis Date   Abnormal stress test 2011   SEHV-EF 59%, Moderate perfusion defect due to scar, mild periinfarct   Anxiety    Coronary artery disease    Echocardiogram 2011   mild posterior wall hypokinesis   GERD (gastroesophageal reflux disease)    Heart disease    Heart murmur    History of stress test 07/16/2012   abnormal myocardial perfusion study. No significant change from 08/03/2010 study, low risk scan.   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   CORONARY STENT PLACEMENT  10/06/2009   Successful percutaneous coronary intervention of the circumflex marginal-2 vessel with percutaneous transluminal coronary angioplasty, stenting with a 2.75 x 18 mm non-drug eluting stent Vision Stent post-dilated to 3.0 mm.    Home Medications:  Allergies as of 10/05/2020      Reactions   Other    apples   Pantoprazole Other (See Comments)   More stomach problems      Medication List       Accurate as of October 05, 2020  9:37 AM. If you have any questions, ask your nurse or doctor.        AMBULATORY NON FORMULARY MEDICATION 0.2 mLs by Intracavernosal route as needed. Medication Name: Trimix  PGE Pap 30mg  Phent 1mg      aspirin EC 81 MG tablet Take 81 mg by mouth daily.   clopidogrel 75 MG tablet Commonly known as: PLAVIX Take 1 tablet (75 mg total) by mouth daily.   clotrimazole-betamethasone cream Commonly known as: LOTRISONE APPLY TO AFFECTED AREA TWICE DAILY   Co Q 10 100 MG Caps Take 1 capsule by mouth daily.   ezetimibe 10 MG tablet Commonly known as: ZETIA Take 1 tablet (10 mg total) by mouth daily.   Ginger (Zingiber officinalis) 550 MG Caps Take 1 capsule by mouth daily.   Magnesium 250 MG Tabs Take 1 tablet by mouth daily.   multivitamin with minerals Tabs tablet Take 1 tablet by mouth daily.   nebivolol 5 MG tablet Commonly known as: BYSTOLIC Take 1 tablet (5 mg total) by mouth daily.   niacin 500 MG CR tablet Commonly known as: NIASPAN Take 1 tablet (500 mg total) by mouth at bedtime.   omega-3 acid ethyl esters 1 g capsule Commonly known as: Lovaza Take 2 capsules (2 g total) by mouth 2 (two) times daily.   rosuvastatin 40 MG tablet Commonly known as: CRESTOR Take 1 tablet (40 mg total) by mouth daily. KEEP OV.   SAW PALMETTO BERRIES PO Take 1 tablet by mouth daily.   sildenafil 50 MG tablet Commonly known as: VIAGRA Take 50-100 mg by mouth daily as needed for erectile dysfunction.   sodium polystyrene powder  Commonly known as: KAYEXALATE Take 30g PO x1 dose.   SUPER B COMPLEX PO Take 1 tablet by mouth daily.   valsartan 160 MG tablet Commonly known as: DIOVAN Take 1 tablet (160 mg total) by mouth daily.   vitamin C 1000 MG tablet Take 1,000 mg by mouth 2 (two) times daily.   vitamin E 180 MG (400 UNITS) capsule Take 400 Units by mouth daily.   Zinc 50 MG Tabs Take 1 tablet by mouth daily.       Allergies:  Allergies  Allergen Reactions   Other     apples   Pantoprazole Other (See Comments)    More stomach problems    Family History: Family History  Problem Relation Age of Onset   Hypertension Mother    Hyperlipidemia Mother     Arthritis Mother    Hypertension Father    Hyperlipidemia Father    Cancer Father        kidney- METASTATIC   Hypertension Brother    Hyperlipidemia Brother    Hypertension Sister    Hyperlipidemia Sister    Cancer - Lung Maternal Grandmother    Cancer Maternal Grandmother        breast    Social History:  reports that he quit smoking about 13 years ago. His smoking use included cigarettes. He has a 76.00 pack-year smoking history. He quit smokeless tobacco use about 13 years ago. He reports current alcohol use of about 7.0 - 10.0 standard drinks of alcohol per week. He reports that he does not use drugs.  ROS: All other review of systems were reviewed and are negative except what is noted above in HPI  Physical Exam: BP 138/78    Pulse 60    Temp 98.5 F (36.9 C)    Ht 5\' 10"  (1.778 m)    Wt 222 lb (100.7 kg)    BMI 31.85 kg/m   Constitutional:  Alert and oriented, No acute distress. HEENT: [place] AT, moist mucus membranes.  Trachea midline, no masses. Cardiovascular: No clubbing, cyanosis, or edema. Respiratory: Normal respiratory effort, no increased work of breathing. GI: Abdomen is soft, nontender, nondistended, no abdominal masses GU: No CVA tenderness.  Lymph: No cervical or inguinal lymphadenopathy. Skin: No rashes, bruises or suspicious lesions. Neurologic: Grossly intact, no focal deficits, moving all 4 extremities. Psychiatric: Normal mood and affect.  Laboratory Data: Lab Results  Component Value Date   WBC 5.6 05/09/2020   HGB 15.4 05/09/2020   HCT 45.3 05/09/2020   MCV 90 05/09/2020   PLT 269 05/09/2020    Lab Results  Component Value Date   CREATININE 0.81 08/25/2020    Lab Results  Component Value Date   PSA 3.52 08/09/2020   PSA 3.0 05/20/2018   PSA 3.0 02/12/2017    Lab Results  Component Value Date   TESTOSTERONE 234 (L) 02/12/2017    Lab Results  Component Value Date   HGBA1C 5.4 08/09/2020    Urinalysis    Component  Value Date/Time   BILIRUBINUR negative 07/28/2012 1357   PROTEINUR negative 07/28/2012 1357   UROBILINOGEN 0.2 07/28/2012 1357   NITRITE negative 07/28/2012 1357   LEUKOCYTESUR Negative 07/28/2012 1357    No results found for: LABMICR, WBCUA, RBCUA, LABEPIT, MUCUS, BACTERIA  Pertinent Imaging:  No results found for this or any previous visit.  No results found for this or any previous visit.  No results found for this or any previous visit.  No results found for this or  any previous visit.  No results found for this or any previous visit.  No results found for this or any previous visit.  No results found for this or any previous visit.  No results found for this or any previous visit.  Trimix injection instruction:  Patient instructed to draw 0.31ml of trimix into the insulin syringe. I them instructed him to inject at the mid penile shaft at either the 3 or 9 o'clock position. I then injected the patient and he achieved a good erection in 20 minutes. Penile injection completed.  Assessment & Plan:    1. Erectile dysfunction due to arterial insufficiency -The patient was instructed on proper technique for trimix injection. He is instructed to alternate side and location of the injection. He was instructed in titrating the trimix dose. He is instructed to call the office for an erection lasting more than 4 hours.   No follow-ups on file.  Wilkie Aye, MD  [facility] [place]

## 2020-10-17 ENCOUNTER — Encounter: Payer: Self-pay | Admitting: Urology

## 2020-10-17 NOTE — Patient Instructions (Signed)
Erectile Dysfunction Erectile dysfunction (ED) is the inability to get or keep an erection in order to have sexual intercourse. Erectile dysfunction may include:  Inability to get an erection.  Lack of enough hardness of the erection to allow penetration.  Loss of the erection before sex is finished. What are the causes? This condition may be caused by:  Certain medicines, such as: ? Pain relievers. ? Antihistamines. ? Antidepressants. ? Blood pressure medicines. ? Water pills (diuretics). ? Ulcer medicines. ? Muscle relaxants. ? Drugs.  Excessive drinking.  Psychological causes, such as: ? Anxiety. ? Depression. ? Sadness. ? Exhaustion. ? Performance fear. ? Stress.  Physical causes, such as: ? Artery problems. This may include diabetes, smoking, liver disease, or atherosclerosis. ? High blood pressure. ? Hormonal problems, such as low testosterone. ? Obesity. ? Nerve problems. This may include back or pelvic injuries, diabetes mellitus, multiple sclerosis, or Parkinson disease. What are the signs or symptoms? Symptoms of this condition include:  Inability to get an erection.  Lack of enough hardness of the erection to allow penetration.  Loss of the erection before sex is finished.  Normal erections at some times, but with frequent unsatisfactory episodes.  Low sexual satisfaction in either partner due to erection problems.  A curved penis occurring with erection. The curve may cause pain or the penis may be too curved to allow for intercourse.  Never having nighttime erections. How is this diagnosed? This condition is often diagnosed by:  Performing a physical exam to find other diseases or specific problems with the penis.  Asking you detailed questions about the problem.  Performing blood tests to check for diabetes mellitus or to measure hormone levels.  Performing other tests to check for underlying health conditions.  Performing an ultrasound  exam to check for scarring.  Performing a test to check blood flow to the penis.  Doing a sleep study at home to measure nighttime erections. How is this treated? This condition may be treated by:  Medicine taken by mouth to help you achieve an erection (oral medicine).  Hormone replacement therapy to replace low testosterone levels.  Medicine that is injected into the penis. Your health care provider may instruct you how to give yourself these injections at home.  Vacuum pump. This is a pump with a ring on it. The pump and ring are placed on the penis and used to create pressure that helps the penis become erect.  Penile implant surgery. In this procedure, you may receive: ? An inflatable implant. This consists of cylinders, a pump, and a reservoir. The cylinders can be inflated with a fluid that helps to create an erection, and they can be deflated after intercourse. ? A semi-rigid implant. This consists of two silicone rubber rods. The rods provide some rigidity. They are also flexible, so the penis can both curve downward in its normal position and become straight for sexual intercourse.  Blood vessel surgery, to improve blood flow to the penis. During this procedure, a blood vessel from a different part of the body is placed into the penis to allow blood to flow around (bypass) damaged or blocked blood vessels.  Lifestyle changes, such as exercising more, losing weight, and quitting smoking. Follow these instructions at home: Medicines   Take over-the-counter and prescription medicines only as told by your health care provider. Do not increase the dosage without first discussing it with your health care provider.  If you are using self-injections, perform injections as directed by your   health care provider. Make sure to avoid any veins that are on the surface of the penis. After giving an injection, apply pressure to the injection site for 5 minutes. General  instructions  Exercise regularly, as directed by your health care provider. Work with your health care provider to lose weight, if needed.  Do not use any products that contain nicotine or tobacco, such as cigarettes and e-cigarettes. If you need help quitting, ask your health care provider.  Before using a vacuum pump, read the instructions that come with the pump and discuss any questions with your health care provider.  Keep all follow-up visits as told by your health care provider. This is important. Contact a health care provider if:  You feel nauseous.  You vomit. Get help right away if:  You are taking oral or injectable medicines and you have an erection that lasts longer than 4 hours. If your health care provider is unavailable, go to the nearest emergency room for evaluation. An erection that lasts much longer than 4 hours can result in permanent damage to your penis.  You have severe pain in your groin or abdomen.  You develop redness or severe swelling of your penis.  You have redness spreading up into your groin or lower abdomen.  You are unable to urinate.  You experience chest pain or a rapid heart beat (palpitations) after taking oral medicines. Summary  Erectile dysfunction (ED) is the inability to get or keep an erection during sexual intercourse. This problem can usually be treated successfully.  This condition is diagnosed based on a physical exam, your symptoms, and tests to determine the cause. Treatment varies depending on the cause, and may include medicines, hormone therapy, surgery, or vacuum pump.  You may need follow-up visits to make sure that you are using your medicines or devices correctly.  Get help right away if you are taking or injecting medicines and you have an erection that lasts longer than 4 hours. This information is not intended to replace advice given to you by your health care provider. Make sure you discuss any questions you have with  your health care provider. Document Revised: 10/17/2017 Document Reviewed: 11/20/2016 Elsevier Patient Education  2020 [employer]  

## 2020-10-30 ENCOUNTER — Encounter: Payer: Self-pay | Admitting: Cardiovascular Disease

## 2020-10-30 ENCOUNTER — Ambulatory Visit (INDEPENDENT_AMBULATORY_CARE_PROVIDER_SITE_OTHER): Payer: BC Managed Care – PPO | Admitting: Cardiovascular Disease

## 2020-10-30 ENCOUNTER — Other Ambulatory Visit: Payer: Self-pay

## 2020-10-30 DIAGNOSIS — I249 Acute ischemic heart disease, unspecified: Secondary | ICD-10-CM

## 2020-10-30 DIAGNOSIS — I2583 Coronary atherosclerosis due to lipid rich plaque: Secondary | ICD-10-CM

## 2020-10-30 DIAGNOSIS — I10 Essential (primary) hypertension: Secondary | ICD-10-CM | POA: Diagnosis not present

## 2020-10-30 DIAGNOSIS — E782 Mixed hyperlipidemia: Secondary | ICD-10-CM

## 2020-10-30 DIAGNOSIS — I2510 Atherosclerotic heart disease of native coronary artery without angina pectoris: Secondary | ICD-10-CM

## 2020-10-30 DIAGNOSIS — E669 Obesity, unspecified: Secondary | ICD-10-CM

## 2020-10-30 NOTE — Progress Notes (Signed)
Patient ID: Jonathan [name], male   DOB: Dec 22, 1960, 59 y.o.   MRN: [id number]     Primary MD: Dr. Milinda Antis  HPI: Jonathan [name] is a 59 y.o. male presents to the office for a 37 month follow-up cardiology evaluation.  Jonathan [name] suffered an acute coronary syndrome in November 2010 and was found to have totally occluded left circumflex marginal vessel at catheterization. A 2.75x18 mm vision stent was inserted by me with reestablishment of TIMI-3 flow, post dilated to 3 mm. He had a repeat catheterization in January 2012 and a stent was widely patent. He did have mild narrowing in the LAD. Additional problems include hyperlipidemia as well as hypertension. A nuclear perfusion study in August 2013 was unchanged from previously and showed a small area of mid to basal inferolateral scar. There was no ischemia. Post-rest ejection fraction was 67%.  When I saw him in 2015  his LDL cholesterol had risen to 101 and at that time, further titration of his Crestor to to 20 mg was recommended.  He had developed mild hyperkalemia on ACE inhibitor vision and his lisinopril dose was ultimately discontinued. Last year, laboratory by his primary physician Dr. Kevin Fenton  revealed hemoglobin 15.3, hematocrit 44.2.  Chemistry profile revealed potassium 4.7.  Renal function was excellent with a creatinine of 0.71 and a BUN of 10.  His most recent lipid studies showed a total cholesterol of 155, triglycerides 57, HDL 61, and his LDL had improved to 83.  He underwent a nuclear perfusion study on 05/11/2015. This remained low risk and only showed a very small region of scar the inferolateral wall at the base.  Normal function with an ejection fraction of 57%.  There was no ischemia.  In March  2018 total cholesterol was 136, HDL 54, LDL 68, and triglycerides 72.  He continues to be on Crestor 40 mg, and  omega-3 fatty acid.  He has not had recurrent CAD symptoms and continues to be on ASA/Plavix.   I last  saw him in November 2018.  Since his prior evaluation he remained active and was walking at least 5 days/week.  He denied any chest pain, PND orthopnea. As part of his DOT physical requirements he underwent a nuclear stress test on 08/06/2017.  EF was 65%.  No ST segment changes.  It was mild diaphragmatic attenuation artifact without scar or ischemia.  Study was interpreted as low risk.    Since his last evaluation with me he has been seen by Bailey Mech, NP in 2020 and most recently in June 2021.  No longer has a required DOT nuclear stress test.  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  He denies any recurrent chest pain or shortness of breath.  He had undergone laboratory in September 2021 which showed a total cholesterol 108 and HDL of 38 triglycerides were elevated at 319 and LDL cholesterol was 36.  Lovaza 2 g twice daily was added to his medical regimen of Crestor and Zetia.  Laboratory in October 2021 showed normal renal function with a BUN of 11 and a creatinine of 0.81.  Prior to that test his potassium has been elevated in September at 5.5 which improved to 4.6.  He presents for follow-up evaluation.  Past Medical History:  Diagnosis Date  . Abnormal stress test 2011   SEHV-EF 59%, Moderate perfusion defect due to scar, mild periinfarct  . Anxiety   .  Coronary artery disease   . Echocardiogram 2011   mild posterior wall hypokinesis  . GERD (gastroesophageal reflux disease)   . Heart disease   . Heart murmur   . History of stress test 07/16/2012   abnormal myocardial perfusion study. No significant change from 08/03/2010 study, low risk scan.  [name] Hx of echocardiogram 12/14/2010  . Hyperlipidemia   . Hypertension   . Myocardial infarction [facility]) nov 2010   [name]  . Peripheral vascular disease (HCC)   . Simvastatin-induced rhabdomyolysis   . Substance abuse (HCC)    > 20 years ago, marijauna, cocaine,  hash    Past Surgical History:  Procedure Laterality Date  . CARDIAC CATHETERIZATION  12/14/2010   By Dr [name] showed a widely patent circumflex stent with mild plaque in his LAD.  [name] CORONARY STENT PLACEMENT  10/06/2009   Successful percutaneous coronary intervention of the circumflex marginal-2 vessel with percutaneous transluminal coronary angioplasty, stenting with a 2.75 x 18 mm non-drug eluting stent Vision Stent post-dilated to 3.0 mm.    Allergies  Allergen Reactions  . Other     apples  . Pantoprazole Other (See Comments)    More stomach problems    Current Outpatient Medications  Medication Sig Dispense Refill  . AMBULATORY NON FORMULARY MEDICATION 0.2 mLs by Intracavernosal route as needed. Medication Name: Trimix  PGE Pap 30mg  Phent 1mg  5 mL 5  . Ascorbic Acid (VITAMIN C) 1000 MG tablet Take 1,000 mg by mouth 2 (two) times daily.    [name] aspirin EC 81 MG tablet Take 81 mg by mouth daily.    . B Complex-C (SUPER B COMPLEX PO) Take 1 tablet by mouth daily.    . clopidogrel (PLAVIX) 75 MG tablet Take 1 tablet (75 mg total) by mouth daily. 90 tablet 3  . clotrimazole-betamethasone (LOTRISONE) cream APPLY TO AFFECTED AREA TWICE DAILY 45 g 0  . Coenzyme Q10 (CO Q 10) 100 MG CAPS Take 1 capsule by mouth daily.    [name] ezetimibe (ZETIA) 10 MG tablet Take 1 tablet (10 mg total) by mouth daily. 90 tablet 3  . Ginger, Zingiber officinalis, 550 MG CAPS Take 1 capsule by mouth daily.    . Magnesium 250 MG TABS Take 1 tablet by mouth daily.    . Multiple Vitamin (MULITIVITAMIN WITH MINERALS) TABS Take 1 tablet by mouth daily.    . nebivolol (BYSTOLIC) 5 MG tablet Take 1 tablet (5 mg total) by mouth daily. 90 tablet 3  . niacin (NIASPAN) 500 MG CR tablet Take 1 tablet (500 mg total) by mouth at bedtime. 90 tablet 3  . omega-3 acid ethyl esters (LOVAZA) 1 g capsule Take 2 capsules (2 g total) by mouth 2 (two) times daily. 360 capsule 3  . rosuvastatin (CRESTOR) 40 MG tablet Take  1 tablet (40 mg total) by mouth daily. KEEP OV. 90 tablet 3  . Saw Palmetto, Serenoa repens, (SAW PALMETTO BERRIES PO) Take 1 tablet by mouth daily.    . sildenafil (VIAGRA) 50 MG tablet Take 50-100 mg by mouth daily as needed for erectile dysfunction.    . sodium polystyrene (KAYEXALATE) powder Take 30g PO x1 dose. 30 g 0  . valsartan (DIOVAN) 160 MG tablet Take 1 tablet (160 mg total) by mouth daily. 90 tablet 3  . vitamin E 400 UNIT capsule Take 400 Units by mouth daily.    . Zinc 50 MG TABS Take 1 tablet by mouth daily.     No current  facility-administered medications for this visit.    Socially he is married for 34 years. He has 3 children 2 grandchildren. He completed 12th grade education. Works as a Naval architect. He has a prior 30 year history of tobacco use but he quit in August 2008. He does drink occasional alcohol.  ROS General: Negative; No fevers, chills, or night sweats;  HEENT: Negative; No changes in vision or hearing, sinus congestion, difficulty swallowing Pulmonary: Negative; No cough, wheezing, shortness of breath, hemoptysis Cardiovascular: Negative; No chest pain, presyncope, syncope, palpatations GI: Positive for GERD; No nausea, vomiting, diarrhea, or abdominal pain GU: Positive for erectile dysfunction; No dysuria, hematuria, or difficulty voiding Musculoskeletal: Negative; no myalgias, joint pain, or weakness Hematologic/Oncology: Negative; no easy bruising, bleeding Endocrine: Negative; no heat/cold intolerance; no diabetes Neuro: Negative; no changes in balance, headaches Skin: Negative; No rashes or skin lesions Psychiatric: Negative; No behavioral problems, depression Sleep: Negative; No snoring, daytime sleepiness, hypersomnolence, bruxism, restless legs, hypnogognic hallucinations, no cataplexy Other comprehensive 14 point system review is negative.  PE BP (!) 148/81   Pulse 60   Ht 5\' 10"  (1.778 m)   Wt 226 lb (102.5 kg)   SpO2 98%   BMI 32.43 kg/m     Repeat blood pressure by me was 128/80  Wt Readings from Last 3 Encounters:  10/30/20 226 lb (102.5 kg)  10/05/20 222 lb (100.7 kg)  09/21/20 222 lb (100.7 kg)   General: Alert, oriented, no distress.  Skin: normal turgor, no rashes, warm and dry HEENT: Normocephalic, atraumatic. Pupils equal round and reactive to light; sclera anicteric; extraocular muscles intact;  Nose without nasal septal hypertrophy Mouth/Parynx benign; Mallinpatti scale 2 Neck: No JVD, no carotid bruits; normal carotid upstroke Lungs: clear to ausculatation and percussion; no wheezing or rales Chest wall: without tenderness to palpitation Heart: PMI not displaced, RRR, s1 s2 normal, 1/6 systolic murmur, no diastolic murmur, no rubs, gallops, thrills, or heaves Abdomen: soft, nontender; no hepatosplenomehaly, BS+; abdominal aorta nontender and not dilated by palpation. Back: no CVA tenderness Pulses 2+ Musculoskeletal: full range of motion, normal strength, no joint deformities Extremities: no clubbing cyanosis or edema, Homan's sign negative  Neurologic: grossly nonfocal; Cranial nerves grossly wnl Psychologic: Normal mood and affect  ECG (independently read by me): Normal sinus rhythm at 60 bpm.  No ectopy.  Normal intervals.  June 2018 ECG (independently read by me): Normal sinus rhythm at 71 bpm.  Early transition.  No ST segment changes.  May 2018 ECG (independently read by me):Normal sinus rhythm at 66 bpm.  No ST segment changes.  Normal intervals.  June 2017 ECG (independently read by me): Normal sinus rhythm at 65 bpm.  Normal intervals.  No ST segment changes.  July 2016 ECG (independently read by me):  Normal sinus rhythm at 69 bpm.  No ectopy.  Normal intervals.  No ECG evidence for prior infarction.  July 2015 ECG (independently read by me): Normal sinus rhythm at 62 beats per minute.  Intervals, normal.  July 2014 ECG: Sinus rhythm 58 beats per minute. No significant ST  changes.  LABS:  BMP Latest Ref Rng & Units 08/25/2020 08/09/2020 05/09/2020  Glucose 65 - 99 mg/dL 84 85 91  BUN 7 - 25 mg/dL 11 14 5(L)  Creatinine 0.70 - 1.25 mg/dL 2.95 6.21 3.08(M)  BUN/Creat Ratio 6 - 22 (calc) NOT APPLICABLE NOT APPLICABLE 7(L)  Sodium 135 - 146 mmol/L 141 139 141  Potassium 3.5 - 5.3 mmol/L 4.6 5.5(H) 5.3(H)  Chloride  98 - 110 mmol/L 105 104 106  CO2 20 - 32 mmol/L 28 22 23   Calcium 8.6 - 10.3 mg/dL 9.6 9.7 8.9   Hepatic Function Latest Ref Rng & Units 08/09/2020 05/09/2020 04/01/2018  Total Protein 6.1 - 8.1 g/dL 6.5 6.6 6.8  Albumin 3.8 - 4.8 g/dL - 4.5 4.8  AST 10 - 35 U/L 34 24 23  ALT 9 - 46 U/L 38 24 28  Alk Phosphatase 48 - 121 IU/L - 76 60  Total Bilirubin 0.2 - 1.2 mg/dL 0.7 0.5 1.2  Bilirubin, Direct 0.00 - 0.40 mg/dL - - 3.66   CBC Latest Ref Rng & Units 05/09/2020 04/01/2018 02/12/2017  WBC 3.4 - 10.8 x10E3/uL 5.6 6.8 5.5  Hemoglobin 13.0 - 17.7 g/dL 44.0 34.7 42.5  Hematocrit 37.5 - 51.0 % 45.3 43.5 48.2  Platelets 150 - 450 x10E3/uL 269 222 252   Lab Results  Component Value Date   MCV 90 05/09/2020   MCV 90 04/01/2018   MCV 92.5 02/12/2017   Lab Results  Component Value Date   TSH 1.23 02/12/2017   Lab Results  Component Value Date   HGBA1C 5.4 08/09/2020   Lipid Panel     Component Value Date/Time   CHOL 108 08/09/2020 0902   CHOL 111 05/09/2020 0905   TRIG 319 (H) 08/09/2020 0902   HDL 38 (L) 08/09/2020 0902   HDL 42 05/09/2020 0905   CHOLHDL 2.8 08/09/2020 0902   VLDL 14 02/12/2017 0950   LDLCALC 36 08/09/2020 0902     IMPRESSION:  1. ACS (acute coronary syndrome) [facility]): November 2010 with successful PCI/DES stent to totally occluded circumflex marginal vessel.   2. Coronary artery disease due to lipid rich plaque   3. Essential hypertension   4. Mixed hyperlipidemia   5. Mild obesity      ASSESSMENT AND PLAN: Jonathan [name] is a 59 year old male who suffered an acute coronary syndrome in November 2010 secondary to  total circumflex marginal occlusion and underwent successful intervention.  At  catheterization in January 2012 he had mild smooth in-stent narrowing of less than 30%. A nuclear perfusion study in 04/2015  was unchanged and showed only a very small region of basal inferolateral scar without ischemia and remains low risk.  Presently, he feels well.  His blood pressure when rechecked by me was stable treatment consisting of nebivolol 5 mg daily and valsartan 160 mg.  He has continued to be on DAPT and denies bleeding with aspirin/Plavix.  He has mixed hyperlipidemia and is now on Zetia 10 mg, rosuvastatin 40 mg, and Lovaza 2 capsules twice a day.  Lovaza was recently added secondary to his significant triglyceride elevation at 319 in September 2021.  His primary MD plans to recheck laboratory.  BMI is consistent with mild obesity.  I discussed optimal exercise at least 5 days/week for at least 30 minutes of moderate intensity if at all possible.  He is not having any anginal symptoms.  I discussed with him most recent hypertensive guidelines.  He will continue current therapy.  I will see him in 1 year for reevaluation or sooner as needed.    Lennette Bihari, MD, [facility]  10/31/2020 3:22 PM

## 2020-10-30 NOTE — Patient Instructions (Signed)
Medication Instructions:  No changes *If you need a refill on your cardiac medications before your next appointment, please call your pharmacy*   Lab Work: None ordered If you have labs (blood work) drawn today and your tests are completely normal, you will receive your results only by: . MyChart Message (if you have MyChart) OR . A paper copy in the mail If you have any lab test that is abnormal or we need to change your treatment, we will call you to review the results.   Testing/Procedures: None ordered   Follow-Up: At [facility], you and your health needs are our priority.  As part of our continuing mission to provide you with exceptional heart care, we have created designated Provider Care Teams.  These Care Teams include your primary Cardiologist (physician) and Advanced Practice Providers (APPs -  Physician Assistants and Nurse Practitioners) who all work together to provide you with the care you need, when you need it.  We recommend signing up for the patient portal called "MyChart".  Sign up information is provided on this After Visit Summary.  MyChart is used to connect with patients for Virtual Visits (Telemedicine).  Patients are able to view lab/test results, encounter notes, upcoming appointments, etc.  Non-urgent messages can be sent to your provider as well.   To learn more about what you can do with MyChart, go to [email or web address].    Your next appointment:   12 month(s)  The format for your next appointment:   In Person  Provider:   Thomas Kelly, MD  Your physician wants you to follow-up in: 12 months. You will receive a reminder letter in the mail two months in advance. If you don't receive a letter, please call our office to schedule the follow-up appointment.  Other Instructions None  

## 2020-10-31 ENCOUNTER — Encounter: Payer: Self-pay | Admitting: Cardiovascular Disease

## 2020-11-24 ENCOUNTER — Telehealth: Payer: Self-pay | Admitting: *Deleted

## 2020-11-24 NOTE — Telephone Encounter (Signed)
Agree with disposition. 

## 2020-11-24 NOTE — Telephone Encounter (Signed)
Received call from patient.   Reports that he has x9 days of cough, SOB on exertion, low grade fever, and body aches.   States that he has not been able to be COVID tested. Reports that he has appointment with NP on Monday.   Advised to continue Sx management with OTC medications. Advised if chest pain, SOB, high fever unresponsive to antipyretics noted, go to ER for evaluation.

## 2020-11-24 NOTE — Telephone Encounter (Signed)
Noted, agree with below 

## 2020-11-25 ENCOUNTER — Other Ambulatory Visit: Payer: Self-pay

## 2020-11-27 ENCOUNTER — Ambulatory Visit (INDEPENDENT_AMBULATORY_CARE_PROVIDER_SITE_OTHER): Payer: BC Managed Care – PPO | Admitting: Nurse Practitioner

## 2020-11-27 ENCOUNTER — Other Ambulatory Visit: Payer: Self-pay

## 2020-11-27 ENCOUNTER — Encounter: Payer: Self-pay | Admitting: Nurse Practitioner

## 2020-11-27 VITALS — Temp 98.6°F

## 2020-11-27 DIAGNOSIS — Z1152 Encounter for screening for COVID-19: Secondary | ICD-10-CM | POA: Diagnosis not present

## 2020-11-27 DIAGNOSIS — J069 Acute upper respiratory infection, unspecified: Secondary | ICD-10-CM | POA: Diagnosis not present

## 2020-11-27 MED ORDER — BENZONATATE 100 MG PO CAPS: 100.0000 mg | ORAL_CAPSULE | Freq: Two times a day (BID) | ORAL | 0 refills | Status: DC | PRN

## 2020-11-27 MED ORDER — GUAIFENESIN ER 600 MG PO TB12: 600.0000 mg | ORAL_TABLET | Freq: Two times a day (BID) | ORAL | 0 refills | Status: DC | PRN

## 2020-11-27 NOTE — Patient Instructions (Signed)

## 2020-11-27 NOTE — Assessment & Plan Note (Signed)
Acute, ongoing and improving slightly.  Likely viral in etiology, will continue with supportive care.  Discussed that cough may last for up to 6 weeks after viral infection.  Reassured patient that symptoms and exam findings are most consistent with a viral upper respiratory infection and explained lack of efficacy of antibiotics against viruses.  Discussed expected course and features suggestive of secondary bacterial infection.  Continue supportive care. Increase fluid intake with water or electrolyte solution like pedialyte. Encouraged acetaminophen as needed for fever/pain. Encouraged salt water gargling, chloraseptic spray and throat lozenges. Encouraged OTC guaifenesin. Encouraged saline sinus flushes and/or neti with humidified air. With any sudden onset new chest pain, dizziness, sweating, or shortness of breath, go to ED.

## 2020-11-27 NOTE — Progress Notes (Signed)
Subjective:    Patient ID: Jonathan [name], male    DOB: [birth year], 60 y.o.   MRN: [id number]  HPI: Jonathan [name] is a 60 y.o. male presenting via parking lot visit due to COVID-19 pandemic for no taste.   Chief Complaint  Patient presents with  . URI   UPPER RESPIRATORY TRACT INFECTION Onset: ~2 weeks ago Worst symptom: "feel like crap" Fever: yes; subjective Cough: yes; mostly dry and at times productive Shortness of breath: no Wheezing: no Chest pain: no Chest tightness: no Chest congestion: no Nasal congestion: no Runny nose: no Post nasal drip: no Sneezing: no Sore throat: yes Swollen glands: no Sinus pressure: yes Headache: yes Face pain: no Toothache: no Ear pain: no  Ear pressure: no  Eyes red/itching:no Eye drainage/crusting: no  Nausea: yes  Vomiting: no Diarrhea: yes  Change in appetite: yes; decreased  Loss of taste/smell: yes  Rash: no Fatigue: yes Sick contacts: yes Strep contacts: no  Context: better this morning Recurrent sinusitis: no Treatments attempted: Nyquil, pepto bismol  Relief with OTC medications: some  Allergies  Allergen Reactions  . Other     apples  . Pantoprazole Other (See Comments)    More stomach problems    Outpatient Encounter Medications as of 11/27/2020  Medication Sig  . benzonatate (TESSALON) 100 MG capsule Take 1 capsule (100 mg total) by mouth 2 (two) times daily as needed for cough. Take the first dose of this medication at nighttime and monitor for drowsiness.  Do not take this medication while driving or operating heavy machinery if it makes you drowsy.  [name] guaiFENesin (MUCINEX) 600 MG 12 hr tablet Take 1 tablet (600 mg total) by mouth 2 (two) times daily as needed for cough or to loosen phlegm.  . AMBULATORY NON FORMULARY MEDICATION 0.2 mLs by Intracavernosal route as needed. Medication Name: Trimix  PGE Pap 30mg  Phent 1mg   . Ascorbic Acid (VITAMIN C) 1000 MG tablet Take 1,000 mg by  mouth 2 (two) times daily.  [name] aspirin EC 81 MG tablet Take 81 mg by mouth daily.  . B Complex-C (SUPER B COMPLEX PO) Take 1 tablet by mouth daily.  . clopidogrel (PLAVIX) 75 MG tablet Take 1 tablet (75 mg total) by mouth daily.  . clotrimazole-betamethasone (LOTRISONE) cream APPLY TO AFFECTED AREA TWICE DAILY  . Coenzyme Q10 (CO Q 10) 100 MG CAPS Take 1 capsule by mouth daily.  [name] ezetimibe (ZETIA) 10 MG tablet Take 1 tablet (10 mg total) by mouth daily.  . Ginger, Zingiber officinalis, 550 MG CAPS Take 1 capsule by mouth daily.  . Magnesium 250 MG TABS Take 1 tablet by mouth daily.  . Multiple Vitamin (MULITIVITAMIN WITH MINERALS) TABS Take 1 tablet by mouth daily.  . nebivolol (BYSTOLIC) 5 MG tablet Take 1 tablet (5 mg total) by mouth daily.  . niacin (NIASPAN) 500 MG CR tablet Take 1 tablet (500 mg total) by mouth at bedtime.  [name] omega-3 acid ethyl esters (LOVAZA) 1 g capsule Take 2 capsules (2 g total) by mouth 2 (two) times daily.  . rosuvastatin (CRESTOR) 40 MG tablet Take 1 tablet (40 mg total) by mouth daily. KEEP OV.  . Saw Palmetto, Serenoa repens, (SAW PALMETTO BERRIES PO) Take 1 tablet by mouth daily.  . sildenafil (VIAGRA) 50 MG tablet Take 50-100 mg by mouth daily as needed for erectile dysfunction.  . sodium polystyrene (KAYEXALATE) powder Take 30g PO x1 dose.  . valsartan (DIOVAN) 160 MG tablet  Take 1 tablet (160 mg total) by mouth daily.  . vitamin E 400 UNIT capsule Take 400 Units by mouth daily.  . Zinc 50 MG TABS Take 1 tablet by mouth daily.   No facility-administered encounter medications on file as of 11/27/2020.    Patient Active Problem List   Diagnosis Date Noted  . Upper respiratory tract infection 11/27/2020  . Obesity (BMI 30.0-34.9) 08/09/2020  . Routine general medical examination at a health care facility 07/20/2014  . Erectile dysfunction 02/09/2013  . Essential hypertension, benign 07/29/2012  . Microscopic hematuria 07/29/2012  . CAD (coronary artery  disease) 12/11/2011  . Claudication of calf muscles (HCC) 12/11/2011  . GERD (gastroesophageal reflux disease) 12/11/2011  . Hyperlipidemia 12/11/2011    Past Medical History:  Diagnosis Date  . Abnormal stress test 2011   SEHV-EF 59%, Moderate perfusion defect due to scar, mild periinfarct  . Anxiety   . Coronary artery disease   . Echocardiogram 2011   mild posterior wall hypokinesis  . GERD (gastroesophageal reflux disease)   . Heart disease   . Heart murmur   . History of stress test 07/16/2012   abnormal myocardial perfusion study. No significant change from 08/03/2010 study, low risk scan.  [name] Hx of echocardiogram 12/14/2010  . Hyperlipidemia   . Hypertension   . Myocardial infarction [facility]) nov 2010   [name]  . Peripheral vascular disease (HCC)   . Simvastatin-induced rhabdomyolysis   . 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 Interim medical history since our last visit reviewed.  Review of Systems  Constitutional: Positive for activity change, appetite change, chills, fatigue and fever.  HENT: Positive for sinus pressure and sore throat. Negative for congestion, ear discharge, ear pain, postnasal drip, rhinorrhea and sinus pain.   Eyes: Negative.  Negative for pain, discharge, redness and itching.  Respiratory: Positive for cough. Negative for chest tightness, shortness of breath and wheezing.   Cardiovascular: Negative.  Negative for chest pain.  Gastrointestinal: Positive for nausea. Negative for diarrhea and vomiting.  Skin: Negative.  Negative for rash.  Neurological: Positive for headaches. Negative for weakness.  Hematological: Negative.  Negative for adenopathy.  Psychiatric/Behavioral: Negative.     Per HPI unless specifically indicated above     Objective:    Temp 98.6 F (37 C) (Oral)   SpO2 94%   Wt Readings from Last 3  Encounters:  10/30/20 226 lb (102.5 kg)  10/05/20 222 lb (100.7 kg)  09/21/20 222 lb (100.7 kg)    Physical Exam Vitals and nursing note reviewed.  Constitutional:      General: He is not in acute distress.    Appearance: Normal appearance. He is not toxic-appearing.  HENT:     Head: Normocephalic and atraumatic.     Right Ear: External ear normal.     Left Ear: External ear normal.     Nose: Nose normal. No congestion.     Right Sinus: No maxillary sinus tenderness or frontal sinus tenderness.     Left Sinus: No maxillary sinus tenderness or frontal sinus tenderness.     Mouth/Throat:     Mouth: Mucous membranes are moist.     Pharynx: Oropharynx is clear.  Eyes:     General: No scleral icterus.    Extraocular Movements: Extraocular movements intact.  Cardiovascular:     Rate and Rhythm: Regular rhythm.  Pulmonary:     Effort: Pulmonary effort is normal. No respiratory distress.     Breath sounds: Normal breath sounds. No wheezing, rhonchi or rales.  Abdominal:     General: Abdomen is flat. Bowel sounds are normal.  Skin:    General: Skin is warm.     Coloration: Skin is not jaundiced or pale.     Findings: No erythema.  Neurological:     Mental Status: He is alert and oriented to person, place, and time.  Psychiatric:        Mood and Affect: Mood normal.        Behavior: Behavior normal.        Thought Content: Thought content normal.        Judgment: Judgment normal.       Assessment & Plan:   Problem List Items Addressed This Visit      Respiratory   Upper respiratory tract infection    Acute, ongoing and improving slightly.  Likely viral in etiology, will continue with supportive care.  Discussed that cough may last for up to 6 weeks after viral infection.  Reassured patient that symptoms and exam findings are most consistent with a viral upper respiratory infection and explained lack of efficacy of antibiotics against viruses.  Discussed expected course and  features suggestive of secondary bacterial infection.  Continue supportive care. Increase fluid intake with water or electrolyte solution like pedialyte. Encouraged acetaminophen as needed for fever/pain. Encouraged salt water gargling, chloraseptic spray and throat lozenges. Encouraged OTC guaifenesin. Encouraged saline sinus flushes and/or neti with humidified air. With any sudden onset new chest pain, dizziness, sweating, or shortness of breath, go to ED.         Other Visit Diagnoses    Encounter for screening for COVID-19    -  Primary   Relevant Orders   SARS-COV-2 RNA,(COVID-19) QUAL NAAT       Follow up plan: Return if symptoms worsen or fail to improve.

## 2020-11-29 LAB — SARS-COV-2 RNA,(COVID-19) QUALITATIVE NAAT: SARS CoV2 RNA: DETECTED — AB

## 2020-11-30 ENCOUNTER — Encounter: Payer: Self-pay | Admitting: *Deleted

## 2021-04-04 ENCOUNTER — Encounter: Payer: Self-pay | Admitting: Urology

## 2021-04-04 ENCOUNTER — Other Ambulatory Visit: Payer: Self-pay

## 2021-04-04 ENCOUNTER — Ambulatory Visit (INDEPENDENT_AMBULATORY_CARE_PROVIDER_SITE_OTHER): Payer: BC Managed Care – PPO | Admitting: Urology

## 2021-04-04 VITALS — BP 156/83 | HR 60 | Temp 97.9°F | Ht 70.0 in

## 2021-04-04 DIAGNOSIS — N5201 Erectile dysfunction due to arterial insufficiency: Secondary | ICD-10-CM

## 2021-04-04 LAB — URINALYSIS, MICROSCOPIC ONLY
Bacteria, UA: NONE SEEN
Epithelial Cells (non renal): NONE SEEN /hpf (ref 0–10)

## 2021-04-04 MED ORDER — VARDENAFIL HCL 20 MG PO TABS: 20.0000 mg | ORAL_TABLET | Freq: Every day | ORAL | 0 refills | Status: DC | PRN

## 2021-04-04 NOTE — Progress Notes (Signed)
Urological Symptom Review  Patient is experiencing the following symptoms: Erection Problems Get up at night to urinate    Review of Systems  Gastrointestinal (upper)  : Negative for upper GI symptoms  Gastrointestinal (lower) : Negative for lower GI symptoms  Constitutional : Negative for symptoms  Skin: Negative for skin symptoms  Eyes: Negative for eye symptoms  Ear/Nose/Throat : Negative for Ear/Nose/Throat symptoms  Hematologic/Lymphatic: Easy bruising  Cardiovascular : Negative for cardiovascular symptoms  Respiratory : Negative for respiratory symptoms  Endocrine: Negative for endocrine symptoms  Musculoskeletal: Back pain Joint pain  Neurological: Negative for neurological symptoms  Psychologic: Negative for psychiatric symptoms

## 2021-04-04 NOTE — Progress Notes (Signed)
04/04/2021 9:18 AM   Jonathan [name] 04/27/1961 [id number]  Referring provider: Salley Scarlet, MD [address] [place],  [place] [address]  Erectile dysfunction  HPI: Mr Donati is a 60yo here for followup for erectile dysfunction. He was using trimix 0.24ml which only gave him a semifirm erection. He did not have sildenafil, tadalafil which failed to give him a firm erection. Good Libido.    PMH: Past Medical History:  Diagnosis Date  . Abnormal stress test 2011   SEHV-EF 59%, Moderate perfusion defect due to scar, mild periinfarct  . Anxiety   . Coronary artery disease   . Echocardiogram 2011   mild posterior wall hypokinesis  . GERD (gastroesophageal reflux disease)   . Heart disease   . Heart murmur   . History of stress test 07/16/2012   abnormal myocardial perfusion study. No significant change from 08/03/2010 study, low risk scan.  [name] Hx of echocardiogram 12/14/2010  . Hyperlipidemia   . Hypertension   . Myocardial infarction [facility]) nov 2010   [name]  . Peripheral vascular disease (HCC)   . Simvastatin-induced rhabdomyolysis   . 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 CARDIAC CATHETERIZATION  12/14/2010   By Dr [name] showed a widely patent circumflex stent with mild plaque in his LAD.  [name] CORONARY STENT PLACEMENT  10/06/2009   Successful percutaneous coronary intervention of the circumflex marginal-2 vessel with percutaneous transluminal coronary angioplasty, stenting with a 2.75 x 18 mm non-drug eluting stent Vision Stent post-dilated to 3.0 mm.    Home Medications:  Allergies as of 04/04/2021      Reactions   Other    apples   Pantoprazole Other (See Comments)   More stomach problems      Medication List       Accurate as of Apr 04, 2021  9:18 AM. If you have any questions, ask your nurse or doctor.        STOP taking these  medications   benzonatate 100 MG capsule Commonly known as: TESSALON Stopped by: Wilkie Aye, MD   guaiFENesin 600 MG 12 hr tablet Commonly known as: Mucinex Stopped by: Wilkie Aye, MD     TAKE these medications   AMBULATORY NON FORMULARY MEDICATION 0.2 mLs by Intracavernosal route as needed. Medication Name: Trimix  PGE Pap 30mg  Phent 1mg    aspirin EC 81 MG tablet Take 81 mg by mouth daily.   clopidogrel 75 MG tablet Commonly known as: PLAVIX Take 1 tablet (75 mg total) by mouth daily.   clotrimazole-betamethasone cream Commonly known as: LOTRISONE APPLY TO AFFECTED AREA TWICE DAILY   Co Q 10 100 MG Caps Take 1 capsule by mouth daily.   ezetimibe 10 MG tablet Commonly known as: ZETIA Take 1 tablet (10 mg total) by mouth daily.   Ginger (Zingiber officinalis) 550 MG Caps Take 1 capsule by mouth daily.   Magnesium 250 MG Tabs Take 1 tablet by mouth daily.   multivitamin with minerals Tabs tablet Take 1 tablet by mouth daily.   nebivolol 5 MG tablet Commonly known as: BYSTOLIC Take 1 tablet (5 mg total) by mouth daily.   niacin 500 MG CR tablet Commonly known as: NIASPAN Take 1 tablet (500 mg total) by mouth at bedtime.   omega-3 acid ethyl esters 1 g capsule Commonly known as: Lovaza Take 2 capsules (2  g total) by mouth 2 (two) times daily.   rosuvastatin 40 MG tablet Commonly known as: CRESTOR Take 1 tablet (40 mg total) by mouth daily. KEEP OV.   SAW PALMETTO BERRIES PO Take 1 tablet by mouth daily.   sildenafil 50 MG tablet Commonly known as: VIAGRA Take 50-100 mg by mouth daily as needed for erectile dysfunction.   sodium polystyrene powder Commonly known as: KAYEXALATE Take 30g PO x1 dose.   SUPER B COMPLEX PO Take 1 tablet by mouth daily.   valsartan 160 MG tablet Commonly known as: DIOVAN Take 1 tablet (160 mg total) by mouth daily.   vitamin C 1000 MG tablet Take 1,000 mg by mouth 2 (two) times daily.   vitamin  E 180 MG (400 UNITS) capsule Take 400 Units by mouth daily.   Zinc 50 MG Tabs Take 1 tablet by mouth daily.       Allergies:  Allergies  Allergen Reactions  . Other     apples  . Pantoprazole Other (See Comments)    More stomach problems    Family History: Family History  Problem Relation Age of Onset  . Hypertension Mother   . Hyperlipidemia Mother   . Arthritis Mother   . Hypertension Father   . Hyperlipidemia Father   . Cancer Father        kidney- METASTATIC  . Hypertension Brother   . Hyperlipidemia Brother   . Hypertension Sister   . Hyperlipidemia Sister   . Cancer - Lung Maternal Grandmother   . Cancer Maternal Grandmother        breast    Social History:  reports that he quit smoking about 14 years ago. His smoking use included cigarettes. He has a 76.00 pack-year smoking history. He quit smokeless tobacco use about 14 years ago. He reports current alcohol use of about 7.0 - 10.0 standard drinks of alcohol per week. He reports that he does not use drugs.  ROS: All other review of systems were reviewed and are negative except what is noted above in HPI  Physical Exam: BP (!) 156/83   Pulse 60   Temp 97.9 F (36.6 C) (Oral)   Ht 5\' 10"  (1.778 m)   BMI 32.43 kg/m   Constitutional:  Alert and oriented, No acute distress. HEENT: [place] AT, moist mucus membranes.  Trachea midline, no masses. Cardiovascular: No clubbing, cyanosis, or edema. Respiratory: Normal respiratory effort, no increased work of breathing. GI: Abdomen is soft, nontender, nondistended, no abdominal masses GU: No CVA tenderness.  Lymph: No cervical or inguinal lymphadenopathy. Skin: No rashes, bruises or suspicious lesions. Neurologic: Grossly intact, no focal deficits, moving all 4 extremities. Psychiatric: Normal mood and affect.  Laboratory Data: Lab Results  Component Value Date   WBC 5.6 05/09/2020   HGB 15.4 05/09/2020   HCT 45.3 05/09/2020   MCV 90 05/09/2020   PLT 269  05/09/2020    Lab Results  Component Value Date   CREATININE 0.81 08/25/2020    Lab Results  Component Value Date   PSA 3.52 08/09/2020   PSA 3.0 05/20/2018   PSA 3.0 02/12/2017    Lab Results  Component Value Date   TESTOSTERONE 234 (L) 02/12/2017    Lab Results  Component Value Date   HGBA1C 5.4 08/09/2020    Urinalysis    Component Value Date/Time   APPEARANCEUR Clear 10/05/2020 1131   GLUCOSEU Negative 10/05/2020 1131   BILIRUBINUR Negative 10/05/2020 1131   PROTEINUR Negative 10/05/2020 1131  UROBILINOGEN 0.2 07/28/2012 1357   NITRITE Negative 10/05/2020 1131   LEUKOCYTESUR 1+ (A) 10/05/2020 1131    Lab Results  Component Value Date   LABMICR See below: 10/05/2020   WBCUA 6-10 (A) 10/05/2020   LABEPIT None seen 10/05/2020   BACTERIA None seen 10/05/2020    Pertinent Imaging:  No results found for this or any previous visit.  No results found for this or any previous visit.  No results found for this or any previous visit.  No results found for this or any previous visit.  No results found for this or any previous visit.  No results found for this or any previous visit.  No results found for this or any previous visit.  No results found for this or any previous visit.   Assessment & Plan:    1. Erectile dysfunction due to arterial insufficiency -We will trial levitra 20mg  prn. If this fails to improve his ED we will increase the dose of the trimix   No follow-ups on file.  Wilkie Aye, MD  [facility] [place]

## 2021-04-04 NOTE — Patient Instructions (Signed)
Erectile Dysfunction Erectile dysfunction (ED) is the inability to get or keep an erection in order to have sexual intercourse. ED is considered a symptom of an underlying disorder and not considered a disease. Erectile dysfunction may include:  Inability to get an erection.  Lack of enough hardness of the erection to allow penetration.  Loss of the erection before sex is finished. What are the causes? This condition may be caused by:  Certain medicines, such as: ? Pain relievers. ? Antihistamines. ? Antidepressants. ? Blood pressure medicines. ? Water pills (diuretics). ? Ulcer medicines. ? Muscle relaxants. ? Drugs.  Excessive drinking.  Psychological causes, such as: ? Anxiety. ? Depression. ? Sadness. ? Exhaustion. ? Performance fear. ? Stress.  Physical causes, such as: ? Artery problems. This may include diabetes, smoking, liver disease, or atherosclerosis. ? High blood pressure. ? Hormonal problems, such as low testosterone. ? Obesity. ? Nerve problems. This may include back or pelvic injuries, diabetes mellitus, multiple sclerosis, or Parkinson's disease. What are the signs or symptoms? Symptoms of this condition include:  Inability to get an erection.  Lack of enough hardness of the erection to allow penetration.  Loss of the erection before sex is finished.  Normal erections at some times, but with frequent unsatisfactory episodes.  Low sexual satisfaction in either partner due to erection problems.  A curved penis occurring with erection. The curve may cause pain or the penis may be too curved to allow for intercourse.  Never having nighttime erections. How is this diagnosed? This condition is often diagnosed by:  Performing a physical exam to find other diseases or specific problems with the penis.  Asking you detailed questions about the problem.  Performing blood tests to check for diabetes mellitus or to measure hormone levels.  Performing  other tests to check for underlying health conditions.  Performing an ultrasound exam to check for scarring.  Performing a test to check blood flow to the penis.  Doing a sleep study at home to measure nighttime erections. How is this treated? This condition may be treated by:  Medicine taken by mouth to help you achieve an erection (oral medicine).  Hormone replacement therapy to replace low testosterone levels.  Medicine that is injected into the penis. Your health care provider may instruct you how to give yourself these injections at home.  Vacuum pump. This is a pump with a ring on it. The pump and ring are placed on the penis and used to create pressure that helps the penis become erect.  Penile implant surgery. In this procedure, you may receive: ? An inflatable implant. This consists of cylinders, a pump, and a reservoir. The cylinders can be inflated with a fluid that helps to create an erection, and they can be deflated after intercourse. ? A semi-rigid implant. This consists of two silicone rubber rods. The rods provide some rigidity. They are also flexible, so the penis can both curve downward in its normal position and become straight for sexual intercourse.  Blood vessel surgery, to improve blood flow to the penis. During this procedure, a blood vessel from a different part of the body is placed into the penis to allow blood to flow around (bypass) damaged or blocked blood vessels.  Lifestyle changes, such as exercising more, losing weight, and quitting smoking. Follow these instructions at home: Medicines  Take over-the-counter and prescription medicines only as told by your health care provider. Do not increase the dosage without first discussing it with your health care   provider.  If you are using self-injections, perform injections as directed by your health care provider. Make sure to avoid any veins that are on the surface of the penis. After giving an injection,  apply pressure to the injection site for 5 minutes.   General instructions  Exercise regularly, as directed by your health care provider. Work with your health care provider to lose weight, if needed.  Do not use any products that contain nicotine or tobacco, such as cigarettes and e-cigarettes. If you need help quitting, ask your health care provider.  Before using a vacuum pump, read the instructions that come with the pump and discuss any questions with your health care provider.  Keep all follow-up visits as told by your health care provider. This is important. Contact a health care provider if:  You feel nauseous.  You vomit. Get help right away if:  You are taking oral or injectable medicines and you have an erection that lasts longer than 4 hours. If your health care provider is unavailable, go to the nearest emergency room for evaluation. An erection that lasts much longer than 4 hours can result in permanent damage to your penis.  You have severe pain in your groin or abdomen.  You develop redness or severe swelling of your penis.  You have redness spreading up into your groin or lower abdomen.  You are unable to urinate.  You experience chest pain or a rapid heart beat (palpitations) after taking oral medicines. Summary  Erectile dysfunction (ED) is the inability to get or keep an erection during sexual intercourse. This problem can usually be treated successfully.  This condition is diagnosed based on a physical exam, your symptoms, and tests to determine the cause. Treatment varies depending on the cause and may include medicines, hormone therapy, surgery, or a vacuum pump.  You may need follow-up visits to make sure that you are using your medicines or devices correctly.  Get help right away if you are taking or injecting medicines and you have an erection that lasts longer than 4 hours. This information is not intended to replace advice given to you by your health  care provider. Make sure you discuss any questions you have with your health care provider. Document Revised: 07/21/2020 Document Reviewed: 07/21/2020 Elsevier Patient Education  2021 [employer]  

## 2021-05-16 ENCOUNTER — Other Ambulatory Visit: Payer: Self-pay | Admitting: Adult Health

## 2021-05-30 ENCOUNTER — Other Ambulatory Visit: Payer: Self-pay | Admitting: Adult Health

## 2021-08-02 DIAGNOSIS — F419 Anxiety disorder, unspecified: Secondary | ICD-10-CM | POA: Insufficient documentation

## 2021-08-02 DIAGNOSIS — I739 Peripheral vascular disease, unspecified: Secondary | ICD-10-CM | POA: Insufficient documentation

## 2021-08-06 ENCOUNTER — Telehealth: Payer: Self-pay | Admitting: Cardiovascular Disease

## 2021-08-06 MED ORDER — CLOPIDOGREL BISULFATE 75 MG PO TABS: 75.0000 mg | ORAL_TABLET | Freq: Every day | ORAL | 3 refills | Status: DC

## 2021-08-06 MED ORDER — ROSUVASTATIN CALCIUM 40 MG PO TABS: 40.0000 mg | ORAL_TABLET | Freq: Every day | ORAL | 3 refills | Status: DC

## 2021-08-06 NOTE — Telephone Encounter (Signed)
*  STAT* If patient is at the pharmacy, call can be transferred to refill team.   1. Which medications need to be refilled? (please list name of each medication and dose if known) rosuvastatin (CRESTOR) 40 MG tablet clopidogrel (PLAVIX) 75 MG tablet  2. Which pharmacy/location (including street and city if local pharmacy) is medication to be sent to? [id number] [place], [place] - [address]  3. Do they need a 30 day or 90 day supply? 90 ds

## 2021-08-07 ENCOUNTER — Other Ambulatory Visit: Payer: Self-pay | Admitting: *Deleted

## 2021-08-07 ENCOUNTER — Encounter: Payer: Self-pay | Admitting: Urology

## 2021-10-03 ENCOUNTER — Other Ambulatory Visit: Payer: Self-pay

## 2021-10-03 ENCOUNTER — Encounter: Payer: Self-pay | Admitting: Urology

## 2021-10-03 ENCOUNTER — Ambulatory Visit (INDEPENDENT_AMBULATORY_CARE_PROVIDER_SITE_OTHER): Payer: BC Managed Care – PPO | Admitting: Urology

## 2021-10-03 VITALS — BP 146/80 | HR 71 | Temp 98.5°F

## 2021-10-03 DIAGNOSIS — N5201 Erectile dysfunction due to arterial insufficiency: Secondary | ICD-10-CM

## 2021-10-03 MED ORDER — AMBULATORY NON FORMULARY MEDICATION: 0.2000 mL | 5 refills | Status: DC | PRN

## 2021-10-03 NOTE — Progress Notes (Signed)
10/03/2021 8:40 AM   Jonathan [name] Jun 03, 1961 [id number]  Referring provider: Salley Scarlet, MD [address] [place],  [place] [address]  Followup erectile dysfunction   HPI: Jonathan [name] is a 60yo here for followup for erectile dysfunction. He uses trimix 0.25ml of 30, 30 and 1mg  which gives him a firm erection that he can maintain. IPSS 12 QOL. Nocturia 1x. Urine stream strong. No other complaints today.    PMH: Past Medical History:  Diagnosis Date   Abnormal stress test 2011   SEHV-EF 59%, Moderate perfusion defect due to scar, mild periinfarct   Anxiety    Coronary artery disease    Echocardiogram 2011   mild posterior wall hypokinesis   GERD (gastroesophageal reflux disease)    Heart disease    Heart murmur    History of stress test 07/16/2012   abnormal myocardial perfusion study. No significant change from 08/03/2010 study, low risk scan.   Hx of echocardiogram 12/14/2010   Hyperlipidemia    Hypertension    Myocardial infarction [facility]) nov 2010   Daphene [name]   Peripheral vascular disease (HCC)    Simvastatin-induced rhabdomyolysis    Substance abuse (HCC)    > 20 years ago, marijauna, cocaine, hash    Surgical History: Past Surgical History:  Procedure Laterality Date   CARDIAC CATHETERIZATION  12/14/2010   By Dr [name] showed a widely patent circumflex stent with mild plaque in his LAD.   CORONARY STENT PLACEMENT  10/06/2009   Successful percutaneous coronary intervention of the circumflex marginal-2 vessel with percutaneous transluminal coronary angioplasty, stenting with a 2.75 x 18 mm non-drug eluting stent Vision Stent post-dilated to 3.0 mm.    Home Medications:  Allergies as of 10/03/2021       Reactions   Other    apples   Pantoprazole Other (See Comments)   More stomach problems        Medication List        Accurate as of October 03, 2021  8:40 AM. If you have any questions, ask your nurse or doctor.           STOP taking these medications    sildenafil 50 MG tablet Commonly known as: VIAGRA Stopped by: Wilkie Aye, MD   sodium polystyrene powder Commonly known as: KAYEXALATE Stopped by: Wilkie Aye, MD       TAKE these medications    AMBULATORY NON FORMULARY MEDICATION 0.2 mLs by Intracavernosal route as needed. Medication Name: Trimix  PGE Pap 30mg  Phent 1mg    aspirin EC 81 MG tablet Take 81 mg by mouth daily.   clopidogrel 75 MG tablet Commonly known as: PLAVIX Take 1 tablet (75 mg total) by mouth daily.   clotrimazole-betamethasone cream Commonly known as: LOTRISONE APPLY TO AFFECTED AREA TWICE DAILY   Co Q 10 100 MG Caps Take 1 capsule by mouth daily.   ezetimibe 10 MG tablet Commonly known as: ZETIA TAKE 1 TABLET BY MOUTH EVERY DAY   Ginger (Zingiber officinalis) 550 MG Caps Take 1 capsule by mouth daily.   Magnesium 250 MG Tabs Take 1 tablet by mouth daily.   multivitamin with minerals Tabs tablet Take 1 tablet by mouth daily.   nebivolol 5 MG tablet Commonly known as: BYSTOLIC Take 1 tablet (5 mg total) by mouth daily.   niacin 500 MG CR tablet Commonly known as: NIASPAN Take 1 tablet (500 mg total) by mouth at bedtime.   omega-3 acid ethyl esters  1 g capsule Commonly known as: Lovaza Take 2 capsules (2 g total) by mouth 2 (two) times daily.   rosuvastatin 40 MG tablet Commonly known as: CRESTOR Take 1 tablet (40 mg total) by mouth daily. KEEP OV.   SAW PALMETTO BERRIES PO Take 1 tablet by mouth daily.   SUPER B COMPLEX PO Take 1 tablet by mouth daily.   valsartan 160 MG tablet Commonly known as: DIOVAN TAKE 1 TABLET BY MOUTH EVERY DAY   vardenafil 20 MG tablet Commonly known as: LEVITRA Take 1 tablet (20 mg total) by mouth daily as needed for erectile dysfunction.   vitamin C 1000 MG tablet Take 1,000 mg by mouth 2 (two) times daily.   vitamin E 180 MG (400 UNITS) capsule Take 400 Units by mouth  daily.   Zinc 50 MG Tabs Take 1 tablet by mouth daily.        Allergies:  Allergies  Allergen Reactions   Other     apples   Pantoprazole Other (See Comments)    More stomach problems    Family History: Family History  Problem Relation Age of Onset   Hypertension Mother    Hyperlipidemia Mother    Arthritis Mother    Hypertension Father    Hyperlipidemia Father    Cancer Father        kidney- METASTATIC   Hypertension Brother    Hyperlipidemia Brother    Hypertension Sister    Hyperlipidemia Sister    Cancer - Lung Maternal Grandmother    Cancer Maternal Grandmother        breast    Social History:  reports that he quit smoking about 14 years ago. His smoking use included cigarettes. He has a 76.00 pack-year smoking history. He quit smokeless tobacco use about 14 years ago. He reports current alcohol use of about 7.0 - 10.0 standard drinks per week. He reports that he does not use drugs.  ROS: All other review of systems were reviewed and are negative except what is noted above in HPI  Physical Exam: BP (!) 146/80   Pulse 71   Temp 98.5 F (36.9 C)   Constitutional:  Alert and oriented, No acute distress. HEENT: [place] AT, moist mucus membranes.  Trachea midline, no masses. Cardiovascular: No clubbing, cyanosis, or edema. Respiratory: Normal respiratory effort, no increased work of breathing. GI: Abdomen is soft, nontender, nondistended, no abdominal masses GU: No CVA tenderness.  Lymph: No cervical or inguinal lymphadenopathy. Skin: No rashes, bruises or suspicious lesions. Neurologic: Grossly intact, no focal deficits, moving all 4 extremities. Psychiatric: Normal mood and affect.  Laboratory Data: Lab Results  Component Value Date   WBC 5.6 05/09/2020   HGB 15.4 05/09/2020   HCT 45.3 05/09/2020   MCV 90 05/09/2020   PLT 269 05/09/2020    Lab Results  Component Value Date   CREATININE 0.81 08/25/2020    Lab Results  Component Value Date   PSA  3.52 08/09/2020   PSA 3.0 05/20/2018   PSA 3.0 02/12/2017    Lab Results  Component Value Date   TESTOSTERONE 234 (L) 02/12/2017    Lab Results  Component Value Date   HGBA1C 5.4 08/09/2020    Urinalysis    Component Value Date/Time   APPEARANCEUR Clear 10/05/2020 1131   GLUCOSEU Negative 10/05/2020 1131   BILIRUBINUR Negative 10/05/2020 1131   PROTEINUR Negative 10/05/2020 1131   UROBILINOGEN 0.2 07/28/2012 1357   NITRITE Negative 10/05/2020 1131   LEUKOCYTESUR 1+ (A) 10/05/2020  1131    Lab Results  Component Value Date   LABMICR See below: 10/05/2020   WBCUA 11-30 (A) 04/04/2021   LABEPIT None seen 04/04/2021   BACTERIA None seen 04/04/2021    Pertinent Imaging:  No results found for this or any previous visit.  No results found for this or any previous visit.  No results found for this or any previous visit.  No results found for this or any previous visit.  No results found for this or any previous visit.  No results found for this or any previous visit.  No results found for this or any previous visit.  No results found for this or any previous visit.   Assessment & Plan:    1. Erectile dysfunction due to arterial insufficiency Continue trimix 0.63ml prn. RTC 1 year   No follow-ups on file.  Wilkie Aye, MD  [facility] [place]

## 2021-10-03 NOTE — Patient Instructions (Signed)
Erectile Dysfunction °Erectile dysfunction (ED) is the inability to get or keep an erection in order to have sexual intercourse. ED is considered a symptom of an underlying disorder and is not considered a disease. ED may include: °Inability to get an erection. °Lack of enough hardness of the erection to allow penetration. °Loss of erection before sex is finished. °What are the causes? °This condition may be caused by: °Physical causes, such as: °Artery problems. This may include heart disease, high blood pressure, atherosclerosis, and diabetes. °Hormonal problems, such as low testosterone. °Obesity. °Nerve problems. This may include back or pelvic injuries, multiple sclerosis, Parkinson's disease, spinal cord injury, and stroke. °Certain medicines, such as: °Pain relievers. °Antidepressants. °Blood pressure medicines and water pills (diuretics). °Cancer medicines. °Antihistamines. °Muscle relaxants. °Lifestyle factors, such as: °Use of drugs such as marijuana, cocaine, or opioids. °Excessive use of alcohol. °Smoking. °Lack of physical activity or exercise. °Psychological causes, such as: °Anxiety or stress. °Sadness or depression. °Exhaustion. °Fear about sexual performance. °Guilt. °What are the signs or symptoms? °Symptoms of this condition include: °Inability to get an erection. °Lack of enough hardness of the erection to allow penetration. °Loss of the erection before sex is finished. °Sometimes having normal erections, but with frequent unsatisfactory episodes. °Low sexual satisfaction in either partner due to erection problems. °A curved penis occurring with erection. The curve may cause pain, or the penis may be too curved to allow for intercourse. °Never having nighttime or morning erections. °How is this diagnosed? °This condition is often diagnosed by: °Performing a physical exam to find other diseases or specific problems with the penis. °Asking you detailed questions about the problem. °Doing tests,  such as: °Blood tests to check for diabetes mellitus or high cholesterol, or to measure hormone levels. °Other tests to check for underlying health conditions. °An ultrasound exam to check for scarring. °A test to check blood flow to the penis. °Doing a sleep study at home to measure nighttime erections. °How is this treated? °This condition may be treated by: °Medicines, such as: °Medicine taken by mouth to help you achieve an erection (oral medicine). °Hormone replacement therapy to replace low testosterone levels. °Medicine that is injected into the penis. Your health care provider may instruct you how to give yourself these injections at home. °Medicine that is delivered with a short applicator tube. The tube is inserted into the opening at the tip of the penis, which is the opening of the urethra. A tiny pellet of medicine is put in the urethra. The pellet dissolves and enhances erectile function. This is also called MUSE (medicated urethral system for erections) therapy. °Vacuum pump. This is a pump with a ring on it. The pump and ring are placed on the penis and used to create pressure that helps the penis become erect. °Penile implant surgery. In this procedure, you may receive: °An inflatable implant. This consists of cylinders, a pump, and a reservoir. The cylinders can be inflated with a fluid that helps to create an erection, and they can be deflated after intercourse. °A semi-rigid implant. This consists of two silicone rubber rods. The rods provide some rigidity. They are also flexible, so the penis can both curve downward in its normal position and become straight for sexual intercourse. °Blood vessel surgery to improve blood flow to the penis. During this procedure, a blood vessel from a different part of the body is placed into the penis to allow blood to flow around (bypass) damaged or blocked blood vessels. °Lifestyle changes,   such as exercising more, losing weight, and quitting smoking. °Follow  these instructions at home: °Medicines ° °Take over-the-counter and prescription medicines only as told by your health care provider. Do not increase the dosage without first discussing it with your health care provider. °If you are using self-injections, do injections as directed by your health care provider. Make sure you avoid any veins that are on the surface of the penis. After giving an injection, apply pressure to the injection site for 5 minutes. °Talk to your health care provider about how to prevent headaches while taking ED medicines. These medicines may cause a sudden headache due to the increase in blood flow in your body. °General instructions °Exercise regularly, as directed by your health care provider. Work with your health care provider to lose weight, if needed. °Do not use any products that contain nicotine or tobacco. These products include cigarettes, chewing tobacco, and vaping devices, such as e-cigarettes. If you need help quitting, ask your health care provider. °Before using a vacuum pump, read the instructions that come with the pump and discuss any questions with your health care provider. °Keep all follow-up visits. This is important. °Contact a health care provider if: °You feel nauseous. °You are vomiting. °You get sudden headaches while taking ED medicines. °You have any concerns about your sexual health. °Get help right away if: °You are taking oral or injectable medicines and you have an erection that lasts longer than 4 hours. If your health care provider is unavailable, go to the nearest emergency room for evaluation. An erection that lasts much longer than 4 hours can result in permanent damage to your penis. °You have severe pain in your groin or abdomen. °You develop redness or severe swelling of your penis. °You have redness spreading at your groin or lower abdomen. °You are unable to urinate. °You experience chest pain or a rapid heartbeat (palpitations) after taking oral  medicines. °These symptoms may represent a serious problem that is an emergency. Do not wait to see if the symptoms will go away. Get medical help right away. Call your local emergency services (911 in the [place] Do not drive yourself to the hospital. °Summary °Erectile dysfunction (ED) is the inability to get or keep an erection during sexual intercourse. °This condition is diagnosed based on a physical exam, your symptoms, and tests to determine the cause. Treatment varies depending on the cause and may include medicines, hormone therapy, surgery, or a vacuum pump. °You may need follow-up visits to make sure that you are using your medicines or devices correctly. °Get help right away if you are taking or injecting medicines and you have an erection that lasts longer than 4 hours. °This information is not intended to replace advice given to you by your health care provider. Make sure you discuss any questions you have with your health care provider. °Document Revised: 01/31/2021 Document Reviewed: 01/31/2021 °Elsevier Patient Education © 2022 [employer] ° °

## 2021-10-03 NOTE — Progress Notes (Signed)
Urological Symptom Review  Patient is experiencing the following symptoms: Frequent urination Get up at night to urinate Stream starts and stops Erection problems (male only)   Review of Systems  Gastrointestinal (upper)  : Negative for upper GI symptoms  Gastrointestinal (lower) : Negative for lower GI symptoms  Constitutional : Negative for symptoms  Skin: Negative for skin symptoms  Eyes: Negative for eye symptoms  Ear/Nose/Throat : Negative for Ear/Nose/Throat symptoms  Hematologic/Lymphatic: Negative for Hematologic/Lymphatic symptoms  Cardiovascular : Negative for cardiovascular symptoms  Respiratory : Negative for respiratory symptoms  Endocrine: Negative for endocrine symptoms  Musculoskeletal: Negative for musculoskeletal symptoms  Neurological: Negative for neurological symptoms  Psychologic: Negative for psychiatric symptoms

## 2021-10-29 ENCOUNTER — Other Ambulatory Visit: Payer: Self-pay

## 2021-10-29 ENCOUNTER — Ambulatory Visit (INDEPENDENT_AMBULATORY_CARE_PROVIDER_SITE_OTHER): Payer: BC Managed Care – PPO | Admitting: Cardiovascular Disease

## 2021-10-29 ENCOUNTER — Encounter: Payer: Self-pay | Admitting: Cardiovascular Disease

## 2021-10-29 VITALS — BP 160/86 | HR 66 | Ht 70.0 in | Wt 230.6 lb

## 2021-10-29 DIAGNOSIS — R7989 Other specified abnormal findings of blood chemistry: Secondary | ICD-10-CM

## 2021-10-29 DIAGNOSIS — I10 Essential (primary) hypertension: Secondary | ICD-10-CM | POA: Diagnosis not present

## 2021-10-29 DIAGNOSIS — I2510 Atherosclerotic heart disease of native coronary artery without angina pectoris: Secondary | ICD-10-CM | POA: Diagnosis not present

## 2021-10-29 DIAGNOSIS — E669 Obesity, unspecified: Secondary | ICD-10-CM

## 2021-10-29 DIAGNOSIS — I252 Old myocardial infarction: Secondary | ICD-10-CM | POA: Diagnosis not present

## 2021-10-29 DIAGNOSIS — Z79899 Other long term (current) drug therapy: Secondary | ICD-10-CM

## 2021-10-29 DIAGNOSIS — I2583 Coronary atherosclerosis due to lipid rich plaque: Secondary | ICD-10-CM | POA: Diagnosis not present

## 2021-10-29 DIAGNOSIS — E782 Mixed hyperlipidemia: Secondary | ICD-10-CM

## 2021-10-29 MED ORDER — ROSUVASTATIN CALCIUM 40 MG PO TABS: 20.0000 mg | ORAL_TABLET | Freq: Every day | ORAL | 3 refills | Status: DC

## 2021-10-29 NOTE — Progress Notes (Signed)
Patient ID: Jonathan [name], male   DOB: February 02, 1961, 60 y.o.   MRN: [id number]     Primary MD: Dr. Milinda Antis  HPI: Jonathan [name] is a 60 y.o. male presents to the office for a 12 month follow-up cardiology evaluation.  [name] suffered an acute coronary syndrome in November 2010 and was found to have totally occluded left circumflex marginal vessel at catheterization. A 2.75x18 mm vision stent was inserted by me with reestablishment of TIMI-3 flow, post dilated to 3 mm. He had a repeat catheterization in January 2012 and a stent was widely patent. He did have mild narrowing in the LAD. Additional problems include hyperlipidemia as well as hypertension. A nuclear perfusion study in August 2013 was unchanged from previously and showed a small area of mid to basal inferolateral scar. There was no ischemia. Post-rest ejection fraction was 67%.  When I saw him in 2015  his LDL cholesterol had risen to 101 and at that time, further titration of his Crestor to to 20 mg was recommended.  He had developed mild hyperkalemia on ACE inhibitor vision and his lisinopril dose was ultimately discontinued. Last year, laboratory by his primary physician Dr. Kevin Fenton  revealed hemoglobin 15.3, hematocrit 44.2.  Chemistry profile revealed potassium 4.7.  Renal function was excellent with a creatinine of 0.71 and a BUN of 10.  His most recent lipid studies showed a total cholesterol of 155, triglycerides 57, HDL 61, and his LDL had improved to 83.  He underwent a nuclear perfusion study on 05/11/2015. This remained low risk and only showed a very small region of scar the inferolateral wall at the base.  Normal function with an ejection fraction of 57%.  There was no ischemia.  In March  2018 total cholesterol was 136, HDL 54, LDL 68, and triglycerides 72.  He continues to be on Crestor 40 mg, and  omega-3 fatty acid.  He has not had recurrent CAD symptoms and continues to be on ASA/Plavix.   I saw  him in November 2018.  Since his prior evaluation he remained active and was walking at least 5 days/week.  He denied any chest pain, PND orthopnea. As part of his DOT physical requirements he underwent a nuclear stress test on 08/06/2017.  EF was 65%.  No ST segment changes.  It was mild diaphragmatic attenuation artifact without scar or ischemia.  Study was interpreted as low risk.    I last saw him on October 30, 2020 and since his prior evaluation with me he has been evaluated by Joni Reining, NP in 2020 and in June 2021.  He no longer has a required DOT nuclear stress test.  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  He denies any recurrent chest pain or shortness of breath.  He had undergone laboratory in September 2021 which showed a total cholesterol 108 and HDL of 38 triglycerides were elevated at 319 and LDL cholesterol was 36.  Lovaza 2 g twice daily was added to his medical regimen of Crestor and Zetia.  Laboratory in October 2021 showed normal renal function with a BUN of 11 and a creatinine of 0.81.  Prior to that test his potassium has been elevated in September at 5.5 which improved to 4.6.    Since I last saw him, over the past year he has continued to feel well.  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  He now sees Sharion Balloon, NP in [place] for primary care.  He had undergone laboratory in September 23, 2021 in [place] which showed total cholesterol 106, triglycerides 63, HDL 59, and LDL 34.  He did have mild AAS T elevation at 47 and ALT was increased at 70.  He has been on rosuvastatin 40 mg in addition to omega-3 fatty acid  and Zetia and has still been taking over-the-counter niacin.  Denies any chest pain or shortness of breath or palpitations.  He presents for yearly evaluation.  Past Medical History:  Diagnosis Date   Abnormal stress test 2011   SEHV-EF  59%, Moderate perfusion defect due to scar, mild periinfarct   Anxiety    Coronary artery disease    Echocardiogram 2011   mild posterior wall hypokinesis   GERD (gastroesophageal reflux disease)    Heart disease    Heart murmur    History of stress test 07/16/2012   abnormal myocardial perfusion study. No significant change from 08/03/2010 study, low risk scan.   Hx of echocardiogram 12/14/2010   Hyperlipidemia    Hypertension    Myocardial infarction [facility]) nov 2010   Daphene [name]   Peripheral vascular disease (HCC)    Simvastatin-induced rhabdomyolysis    Substance abuse (HCC)    > 20 years ago, marijauna, cocaine, hash    Past Surgical History:  Procedure Laterality Date   CARDIAC CATHETERIZATION  12/14/2010   By Dr [name] showed a widely patent circumflex stent with mild plaque in his LAD.   CORONARY STENT PLACEMENT  10/06/2009   Successful percutaneous coronary intervention of the circumflex marginal-2 vessel with percutaneous transluminal coronary angioplasty, stenting with a 2.75 x 18 mm non-drug eluting stent Vision Stent post-dilated to 3.0 mm.    Allergies  Allergen Reactions   Other     apples   Pantoprazole Other (See Comments)    More stomach problems    Current Outpatient Medications  Medication Sig Dispense Refill   AMBULATORY NON FORMULARY MEDICATION 0.2 mLs by Intracavernosal route as needed. Medication Name: Trimix  PGE Pap 30mg  Phent 1mg  5 mL 5   Ascorbic Acid (VITAMIN C) 1000 MG tablet Take 1,000 mg by mouth 2 (two) times daily.     aspirin EC 81 MG tablet Take 81 mg by mouth daily.     B Complex-C (SUPER B COMPLEX PO) Take 1 tablet by mouth daily.     clopidogrel (PLAVIX) 75 MG tablet Take 1 tablet (75 mg total) by mouth daily. 90 tablet 3   clotrimazole-betamethasone (LOTRISONE) cream APPLY TO AFFECTED AREA TWICE DAILY 45 g 0   Coenzyme Q10 (CO Q 10) 100 MG CAPS Take 1 capsule by mouth daily.     ezetimibe (ZETIA) 10 MG tablet TAKE 1 TABLET  BY MOUTH EVERY DAY 90 tablet 3   Ginger, Zingiber officinalis, 550 MG CAPS Take 1 capsule by mouth daily.     Magnesium 250 MG TABS Take 1 tablet by mouth daily.     Multiple Vitamin (MULITIVITAMIN WITH MINERALS) TABS Take 1 tablet by mouth daily.     nebivolol (BYSTOLIC) 5 MG tablet Take 1 tablet (5 mg total) by mouth daily. 90 tablet 3   omega-3 acid ethyl esters (LOVAZA) 1 g capsule Take 2 capsules (2 g total) by mouth 2 (two) times daily. 360 capsule 3   Saw Palmetto, Serenoa repens, (SAW PALMETTO BERRIES PO) Take 1 tablet by mouth daily.  valsartan (DIOVAN) 160 MG tablet TAKE 1 TABLET BY MOUTH EVERY DAY 90 tablet 3   vardenafil (LEVITRA) 20 MG tablet Take 1 tablet (20 mg total) by mouth daily as needed for erectile dysfunction. 6 tablet 0   vitamin E 400 UNIT capsule Take 400 Units by mouth daily.     Zinc 50 MG TABS Take 1 tablet by mouth daily.     rosuvastatin (CRESTOR) 40 MG tablet Take 0.5 tablets (20 mg total) by mouth daily. 90 tablet 3   No current facility-administered medications for this visit.    Socially he is married for 34 years. He has 3 children 2 grandchildren. He completed 12th grade education. Works as a Naval architect. He has a prior 30 year history of tobacco use but he quit in August 2008. He does drink occasional alcohol.  ROS General: Negative; No fevers, chills, or night sweats;  HEENT: Negative; No changes in vision or hearing, sinus congestion, difficulty swallowing Pulmonary: Negative; No cough, wheezing, shortness of breath, hemoptysis Cardiovascular: Negative; No chest pain, presyncope, syncope, palpatations GI: Positive for GERD; No nausea, vomiting, diarrhea, or abdominal pain GU: Positive for erectile dysfunction followed by Dr. Wilkie Aye; No dysuria, hematuria, or difficulty voiding Musculoskeletal: Negative; no myalgias, joint pain, or weakness Hematologic/Oncology: Negative; no easy bruising, bleeding Endocrine: Negative; no heat/cold  intolerance; no diabetes Neuro: Negative; no changes in balance, headaches Skin: Negative; No rashes or skin lesions Psychiatric: Negative; No behavioral problems, depression Sleep: Negative; No snoring, daytime sleepiness, hypersomnolence, bruxism, restless legs, hypnogognic hallucinations, no cataplexy Other comprehensive 14 point system review is negative.  PE BP (!) 160/86 (BP Location: Left Arm)   Pulse 66   Ht 5\' 10"  (1.778 m)   Wt 230 lb 9.6 oz (104.6 kg)   SpO2 98%   BMI 33.09 kg/m    Pete blood pressure by me was significantly improved at 126/70  Wt Readings from Last 3 Encounters:  10/29/21 230 lb 9.6 oz (104.6 kg)  10/30/20 226 lb (102.5 kg)  10/05/20 222 lb (100.7 kg)   General: Alert, oriented, no distress.  Skin: normal turgor, no rashes, warm and dry HEENT: Normocephalic, atraumatic. Pupils equal round and reactive to light; sclera anicteric; extraocular muscles intact;  Nose without nasal septal hypertrophy Mouth/Parynx benign; Mallinpatti scale 2 Neck: No JVD, no carotid bruits; normal carotid upstroke Lungs: clear to ausculatation and percussion; no wheezing or rales Chest wall: without tenderness to palpitation Heart: PMI not displaced, RRR, s1 s2 normal, 1/6 systolic murmur, no diastolic murmur, no rubs, gallops, thrills, or heaves Abdomen: soft, nontender; no hepatosplenomehaly, BS+; abdominal aorta nontender and not dilated by palpation. Back: no CVA tenderness Pulses 2+ Musculoskeletal: full range of motion, normal strength, no joint deformities Extremities: no clubbing cyanosis or edema, Homan's sign negative  Neurologic: grossly nonfocal; Cranial nerves grossly wnl Psychologic: Normal mood and affect   December 12, 2022ECG (independently read by me):  Sinus rhythm at 66 with sinus arythmia  October 30, 2020 ECG (independently read by me): Normal sinus rhythm at 60 bpm.  No ectopy.  Normal intervals.  June 2018 ECG (independently read by me):  Normal sinus rhythm at 71 bpm.  Early transition.  No ST segment changes.  May 2018 ECG (independently read by me):Normal sinus rhythm at 66 bpm.  No ST segment changes.  Normal intervals.  June 2017 ECG (independently read by me): Normal sinus rhythm at 65 bpm.  Normal intervals.  No ST segment changes.  July 2016 ECG (independently  read by me):  Normal sinus rhythm at 69 bpm.  No ectopy.  Normal intervals.  No ECG evidence for prior infarction.  July 2015 ECG (independently read by me): Normal sinus rhythm at 62 beats per minute.  Intervals, normal.  July 2014 ECG: Sinus rhythm 58 beats per minute. No significant ST changes.  LABS:  BMP Latest Ref Rng & Units 08/25/2020 08/09/2020 05/09/2020  Glucose 65 - 99 mg/dL 84 85 91  BUN 7 - 25 mg/dL 11 14 5(L)  Creatinine 0.70 - 1.25 mg/dL 5.36 6.44 0.34(V)  BUN/Creat Ratio 6 - 22 (calc) NOT APPLICABLE NOT APPLICABLE 7(L)  Sodium 135 - 146 mmol/L 141 139 141  Potassium 3.5 - 5.3 mmol/L 4.6 5.5(H) 5.3(H)  Chloride 98 - 110 mmol/L 105 104 106  CO2 20 - 32 mmol/L 28 22 23   Calcium 8.6 - 10.3 mg/dL 9.6 9.7 8.9   Hepatic Function Latest Ref Rng & Units 08/09/2020 05/09/2020 04/01/2018  Total Protein 6.1 - 8.1 g/dL 6.5 6.6 6.8  Albumin 3.8 - 4.8 g/dL - 4.5 4.8  AST 10 - 35 U/L 34 24 23  ALT 9 - 46 U/L 38 24 28  Alk Phosphatase 48 - 121 IU/L - 76 60  Total Bilirubin 0.2 - 1.2 mg/dL 0.7 0.5 1.2  Bilirubin, Direct 0.00 - 0.40 mg/dL - - 4.25   CBC Latest Ref Rng & Units 05/09/2020 04/01/2018 02/12/2017  WBC 3.4 - 10.8 x10E3/uL 5.6 6.8 5.5  Hemoglobin 13.0 - 17.7 g/dL 95.6 38.7 56.4  Hematocrit 37.5 - 51.0 % 45.3 43.5 48.2  Platelets 150 - 450 x10E3/uL 269 222 252   Lab Results  Component Value Date   MCV 90 05/09/2020   MCV 90 04/01/2018   MCV 92.5 02/12/2017   Lab Results  Component Value Date   TSH 1.23 02/12/2017   Lab Results  Component Value Date   HGBA1C 5.4 08/09/2020   Lipid Panel     Component Value Date/Time   CHOL 108  08/09/2020 0902   CHOL 111 05/09/2020 0905   TRIG 319 (H) 08/09/2020 0902   HDL 38 (L) 08/09/2020 0902   HDL 42 05/09/2020 0905   CHOLHDL 2.8 08/09/2020 0902   VLDL 14 02/12/2017 0950   LDLCALC 36 08/09/2020 0902     IMPRESSION:  1. Coronary artery disease due to lipid rich plaque   2. Old MI (myocardial infarction): PCI LCX 2010   3. Mixed hyperlipidemia   4. Essential hypertension   5. Mild obesity   6. Mildly elevated LFTs   7. Medication management     ASSESSMENT AND PLAN: Mr. Haney is a 60 year old male who suffered an acute coronary syndrome in November 2010 secondary to total circumflex marginal occlusion and underwent successful intervention.  At f/u catheterization in January 2012 he had mild smooth in-stent narrowing of less than 30%. A nuclear perfusion study in 04/2015  was unchanged and showed only a very small region of basal inferolateral scar without ischemia and remains low risk.  His blood pressure today on repeat by me was excellent at 126/70 on his regimen consisting of valsartan 160 mg daily and nebivolol 5 mg.  He continues to be on DAPT with aspirin/Plavix and is tolerating this well.  He has been taking Zetia 10 mg, Lovaza 2 capsules twice a day, over-the-counter niacin, and rosuvastatin 40 mg.  Recent laboratory done on September 23, 2021 in [place] showed LDL cholesterol at 34 with triglycerides 63 total cholesterol 106 and HDL 59.  ALT  was 70 with AST at 47.  I have suggested he discontinue over-the-counter niacin and with his excellent LDL with mild transaminase elevation suggested slight reduction in rosuvastatin to 20 mg.  He will be seeing his primary provider in several months and I have suggested in 2 to 3 months with a repeat chemistry and lipid studies to be obtained.  He continues to do well without anginal symptoms or exertional dyspnea or palpitations.  BMI is increased at 33.09 consistent with mild obesity.  We discussed the importance of weight loss  and increased exercise.  I will see him in 1 year for reevaluation or sooner as needed.   Lennette Bihari, MD, [facility]  10/30/2021 1:29 PM

## 2021-10-29 NOTE — Patient Instructions (Addendum)
Medication Instructions:   Decrease rosuvastatin to 20 mg daily   ( 1/2 tablet of 40 mg)   Stop[p taking niaspan   *If you need a refill on your cardiac medications before your next appointment, please call your pharmacy*   Lab Work: Please have your primary  S. Crumpton - draw labs in  2 - 3 months   CMP Lipid fasting   If you have labs (blood work) drawn today and your tests are completely normal, you will receive your results only by: MyChart Message (if you have MyChart) OR A paper copy in the mail If you have any lab test that is abnormal or we need to change your treatment, we will call you to review the results.   Testing/Procedures: Not needed   Follow-Up: At [facility], you and your health needs are our priority.  As part of our continuing mission to provide you with exceptional heart care, we have created designated Provider Care Teams.  These Care Teams include your primary Cardiologist (physician) and Advanced Practice Providers (APPs -  Physician Assistants and Nurse Practitioners) who all work together to provide you with the care you need, when you need it.     Your next appointment:   12 month(s)  The format for your next appointment:   In Person  Provider:   Nicki Guadalajara, MD

## 2021-10-30 ENCOUNTER — Encounter: Payer: Self-pay | Admitting: Cardiovascular Disease

## 2021-11-18 ENCOUNTER — Emergency Department (HOSPITAL_COMMUNITY)
Admission: EM | Admit: 2021-11-18 | Discharge: 2021-11-18 | Disposition: A | Discharge status: Home / Self Care | Payer: BC Managed Care – PPO | Attending: Emergency Medicine | Admitting: Emergency Medicine

## 2021-11-18 ENCOUNTER — Other Ambulatory Visit: Payer: Self-pay

## 2021-11-18 ENCOUNTER — Encounter (HOSPITAL_COMMUNITY): Payer: Self-pay | Admitting: Emergency Medicine

## 2021-11-18 DIAGNOSIS — J36 Peritonsillar abscess: Secondary | ICD-10-CM | POA: Diagnosis not present

## 2021-11-18 DIAGNOSIS — J029 Acute pharyngitis, unspecified: Secondary | ICD-10-CM | POA: Diagnosis present

## 2021-11-18 DIAGNOSIS — Z7982 Long term (current) use of aspirin: Secondary | ICD-10-CM | POA: Diagnosis not present

## 2021-11-18 MED ORDER — CLINDAMYCIN PHOSPHATE 600 MG/50ML IV SOLN
600.0000 mg | Freq: Once | INTRAVENOUS | Status: AC
  Administered 2021-11-18: 600 mg via INTRAVENOUS
  Filled 2021-11-18: qty 50

## 2021-11-18 MED ORDER — CLINDAMYCIN HCL 300 MG PO CAPS: 300.0000 mg | ORAL_CAPSULE | Freq: Three times a day (TID) | ORAL | 0 refills | Status: DC

## 2021-11-18 MED ORDER — SODIUM CHLORIDE 0.9 % IV BOLUS
1000.0000 mL | Freq: Once | INTRAVENOUS | Status: AC
  Administered 2021-11-18: 1000 mL via INTRAVENOUS

## 2021-11-18 NOTE — ED Triage Notes (Signed)
Pt seen at [facility] in [place] today, diagnosed with abscess on his tonsils. Here to have abscess drained.

## 2021-11-18 NOTE — Discharge Instructions (Addendum)
It was our pleasure to provide your ER care today - we hope that you feel better.  Take clindamycin (antibiotic) as prescribed.   Take acetaminophen and/or ibuprofen as need.   Drink plenty of fluids/stay well hydrated.   Follow up with ENT doctor this Tuesday or Wednesday in his office if symptoms fail to improve/resolve - call office Tuesday AM to arrange appointment.   Return to ER right away if worse, if unable to swallow, trouble breathing, or other concern.

## 2021-11-18 NOTE — ED Provider Notes (Signed)
[facility]   CSN: [id number] Arrival date & time: 11/18/21  0114     History  Chief Complaint  Patient presents with   Sore Throat    Jonathan [name] is a 61 y.o. male.  Patient c/o sore throat, primarily on right side, for past three days. Symptoms acute onset, moderate, constant, persistent, worsening, dull, non radiating. Pt went to hospital in [place] last pm, and had ct and they had plans to transfer for ENT eval/drainage, but pt preferred to drive self, and not incur EMS charge. Pt had dose iv unasyn at that facility. Is able to swallow. No trouble breathing. Low grade fevers.   The history is provided by the patient and medical records.  Sore Throat Pertinent negatives include no chest pain, no abdominal pain, no headaches and no shortness of breath.      Home Medications Prior to Admission medications   Medication Sig Start Date End Date Taking? Authorizing Provider  AMBULATORY NON FORMULARY MEDICATION 0.2 mLs by Intracavernosal route as needed. Medication Name: Trimix  PGE 30mcg Pap 30mg  Phent 1mg  10/03/21   McKenzie, Mardene CelestePatrick L, MD  Ascorbic Acid (VITAMIN C) 1000 MG tablet Take 1,000 mg by mouth 2 (two) times daily.    [provider]  aspirin EC 81 MG tablet Take 81 mg by mouth daily.    [provider]  B Complex-C (SUPER B COMPLEX PO) Take 1 tablet by mouth daily.    [provider]  clopidogrel (PLAVIX) 75 MG tablet Take 1 tablet (75 mg total) by mouth daily. 08/06/21   Lennette BihariKelly, Thomas A, MD  clotrimazole-betamethasone (LOTRISONE) cream APPLY TO AFFECTED AREA TWICE DAILY 02/23/18   Salley Scarleturham, Kawanta F, MD  Coenzyme Q10 (CO Q 10) 100 MG CAPS Take 1 capsule by mouth daily.    [provider]  ezetimibe (ZETIA) 10 MG tablet TAKE 1 TABLET BY MOUTH EVERY DAY 05/30/21   Lennette BihariKelly, Thomas A, MD  Ginger, Zingiber officinalis, 550 MG CAPS Take 1 capsule by mouth daily.    [provider]  Magnesium 250 MG TABS Take 1 tablet by mouth daily.    [provider]  Multiple Vitamin (MULITIVITAMIN WITH MINERALS) TABS Take 1 tablet by mouth daily.    [provider]  nebivolol (BYSTOLIC) 5 MG tablet Take 1 tablet (5 mg total) by mouth daily. 05/09/20   Jodelle GrossLawrence, Kathryn M, NP  omega-3 acid ethyl esters (LOVAZA) 1 g capsule Take 2 capsules (2 g total) by mouth 2 (two) times daily. 08/11/20   Salley Scarleturham, Kawanta F, MD  rosuvastatin (CRESTOR) 40 MG tablet Take 0.5 tablets (20 mg total) by mouth daily. 10/29/21   Lennette BihariKelly, Thomas A, MD  Saw Palmetto, Serenoa repens, (SAW PALMETTO BERRIES PO) Take 1 tablet by mouth daily.    [provider]  valsartan (DIOVAN) 160 MG tablet TAKE 1 TABLET BY MOUTH EVERY DAY 05/16/21   Lennette BihariKelly, Thomas A, MD  vardenafil (LEVITRA) 20 MG tablet Take 1 tablet (20 mg total) by mouth daily as needed for erectile dysfunction. 04/04/21   McKenzie, Mardene CelestePatrick L, MD  vitamin E 400 UNIT capsule Take 400 Units by mouth daily.    [provider]  Zinc 50 MG TABS Take 1 tablet by mouth daily.    [provider]      Allergies    Other and Pantoprazole    Review of Systems   Review of Systems  Constitutional:  Positive for fever.  HENT:  Positive for sore throat.   Eyes:  Negative for redness.  Respiratory:  Negative for cough and shortness of breath.   Cardiovascular:  Negative for chest pain.  Gastrointestinal:  Negative for abdominal pain.  Genitourinary:  Negative for flank pain.  Musculoskeletal:  Negative for neck pain and neck stiffness.  Skin:  Negative for rash.  Neurological:  Negative for headaches.  Hematological:  Does not bruise/bleed easily.  Psychiatric/Behavioral:  Negative for confusion.    Physical Exam Updated Vital Signs BP (!) 162/88 (BP Location: Right Arm)    Pulse 80    Temp 99.8 F (37.7 C) (Oral)    Resp 14    SpO2 97%  Physical Exam Vitals and nursing note reviewed.  Constitutional:       Appearance: Normal appearance. He is well-developed.  HENT:     Head: Atraumatic.     Nose: Nose normal.     Mouth/Throat:     Mouth: Mucous membranes are moist.     Pharynx: Posterior oropharyngeal erythema present.     Comments: Right peritonsillar abscess.  Eyes:     General: No scleral icterus.    Conjunctiva/sclera: Conjunctivae normal.  Neck:     Trachea: No tracheal deviation.     Comments: No stiffness or rigidity Cardiovascular:     Rate and Rhythm: Normal rate and regular rhythm.     Pulses: Normal pulses.     Heart sounds: Normal heart sounds. No murmur heard.   No friction rub. No gallop.  Pulmonary:     Effort: Pulmonary effort is normal. No accessory muscle usage or respiratory distress.     Breath sounds: Normal breath sounds. No stridor.  Abdominal:     General: There is no distension.     Tenderness: There is no abdominal tenderness.  Genitourinary:    Comments: No cva tenderness. Musculoskeletal:        General: No swelling.     Cervical back: Normal range of motion and neck supple. No rigidity.  Lymphadenopathy:     Cervical: No cervical adenopathy.  Skin:    General: Skin is warm and dry.     Findings: No rash.  Neurological:     Mental Status: He is alert.     Comments: Alert, speech clear.   Psychiatric:        Mood and Affect: Mood normal.    ED Results / Procedures / Treatments   Labs (all labs ordered are listed, but only abnormal results are displayed) Labs Reviewed - No data to display  EKG None  Radiology No results found.  Procedures Procedures    Medications Ordered in ED Medications - No data to display  ED Course/ Medical Decision Making/ A&P                           Medical Decision Making  Outside records reviewed from [facility] - pt had CT c/w w right peritonsillar abscess, and received iv unasyn.  Reviewed nursing notes and prior charts for additional history. Additional hx from pt/family member.   Iv ns.    ENT consulted for possible I and D PTA in ED.    Outside ct reviewed/interpreted by me - +right peritonsillar abscess. Patient received unasyn and decadron at outside facility.  Discussed pt, and CT with Dr Pollyann Kennedy, ENT - he requests we start on clindamycin and d/c to home with plan to f/u with him in 2-3 days if  symptoms fail to improve/resolve.  Clindamycin 600 mg iv in ED. Iv ns bolus.   Vitals normal.   Pt is handling secretions/swallowing without difficulty, no trouble breathing, no stridor.   Pt currently appears stable for d/c.   Rx provided. Return precautions discussed.           Final Clinical Impression(s) / ED Diagnoses Final diagnoses:  None    Rx / DC Orders ED Discharge Orders     None         Cathren Laine, MD 11/18/21 [phone]

## 2021-11-19 ENCOUNTER — Emergency Department (HOSPITAL_COMMUNITY)
Admission: EM | Admit: 2021-11-19 | Discharge: 2021-11-20 | Disposition: A | Discharge status: Home / Self Care | Payer: BC Managed Care – PPO | Attending: Student | Admitting: Student

## 2021-11-19 ENCOUNTER — Other Ambulatory Visit: Payer: Self-pay

## 2021-11-19 ENCOUNTER — Encounter (HOSPITAL_COMMUNITY): Payer: Self-pay | Admitting: Emergency Medicine

## 2021-11-19 DIAGNOSIS — Z5321 Procedure and treatment not carried out due to patient leaving prior to being seen by health care provider: Secondary | ICD-10-CM | POA: Insufficient documentation

## 2021-11-19 DIAGNOSIS — K122 Cellulitis and abscess of mouth: Secondary | ICD-10-CM | POA: Insufficient documentation

## 2021-11-19 NOTE — ED Triage Notes (Signed)
Patient from home, complaint of oral abscess, was here yesterday, sent with antibiotics, states worse today.

## 2021-11-19 NOTE — ED Provider Notes (Signed)
Emergency Medicine Provider Triage Evaluation Note  Jonathan [name] , a 61 y.o. male  was evaluated in triage.  Pt complains of swelling in his throat.  He was seen yesterday and found to have a PTA.  He was placed on Unasyn and told to follow-up with ENT this week.  He feels as though the area is larger and more bothersome and decided to come back to the emergency department.  Has an appointment with ENT tomorrow.  Review of Systems  Negative: Difficulty breathing or swallowing Physical Exam  BP (!) 142/72    Pulse 70    Temp 98.6 F (37 C) (Oral)    Resp 18    SpO2 95%  Gen:   Awake, no distress   Resp:  Normal effort  MSK:   Moves extremities without difficulty  Other:  Abscess and 1 Place of exudate on right tonsils.  Erythematous.  No obvious draining.  Tolerating secretions and able to swallow.  Medical Decision Making  Medically screening exam initiated at 3:36 PM.  Appropriate orders placed.  Jonathan [name] was informed that the remainder of the evaluation will be completed by another provider, this initial triage assessment does not replace that evaluation, and the importance of remaining in the ED until their evaluation is complete.     Woodroe Chen 11/19/21 1537    Gloris [place], MD 11/21/21 [phone]

## 2021-11-20 NOTE — ED Notes (Signed)
PT opting to leave at this time 

## 2021-11-21 DIAGNOSIS — Z87891 Personal history of nicotine dependence: Secondary | ICD-10-CM | POA: Insufficient documentation

## 2021-12-10 ENCOUNTER — Other Ambulatory Visit: Payer: Self-pay

## 2021-12-10 NOTE — Progress Notes (Signed)
This encounter was created in error - please disregard.

## 2022-05-15 ENCOUNTER — Other Ambulatory Visit: Payer: Self-pay | Admitting: Cardiovascular Disease

## 2022-06-19 ENCOUNTER — Other Ambulatory Visit: Payer: Self-pay | Admitting: Cardiovascular Disease

## 2022-06-19 NOTE — Telephone Encounter (Signed)
Rx(s) sent to pharmacy electronically.  

## 2022-08-07 ENCOUNTER — Other Ambulatory Visit: Payer: Self-pay | Admitting: Cardiovascular Disease

## 2022-10-08 ENCOUNTER — Ambulatory Visit (INDEPENDENT_AMBULATORY_CARE_PROVIDER_SITE_OTHER): Payer: BC Managed Care – PPO | Admitting: Urology

## 2022-10-08 VITALS — BP 131/70 | HR 73

## 2022-10-08 DIAGNOSIS — R972 Elevated prostate specific antigen [PSA]: Secondary | ICD-10-CM

## 2022-10-08 DIAGNOSIS — N5201 Erectile dysfunction due to arterial insufficiency: Secondary | ICD-10-CM

## 2022-10-08 MED ORDER — AMBULATORY NON FORMULARY MEDICATION: 0.2000 mL | 5 refills | Status: DC | PRN

## 2022-10-08 NOTE — Progress Notes (Signed)
10/08/2022 10:16 AM   [name] 1960-12-18 [id number]  Referring provider: No referring provider defined for this encounter.  Followup erectile dysfunction  HPI: Jonathan [name] is a 61yo here for followup for erectile dysfunction. He had a back injury in 11/2021 and did not use the medication until recently. He notes the trimix 0.11ml is not effective. He saw his PCP and PSA was 5.85 up from 3.9 1 year ago. IPSS 7 QOL 2.    PMH: Past Medical History:  Diagnosis Date   Abnormal stress test 2011   SEHV-EF 59%, Moderate perfusion defect due to scar, mild periinfarct   Anxiety    Coronary artery disease    Echocardiogram 2011   mild posterior wall hypokinesis   GERD (gastroesophageal reflux disease)    Heart disease    Heart murmur    History of stress test 07/16/2012   abnormal myocardial perfusion study. No significant change from 08/03/2010 study, low risk scan.   Hx of echocardiogram 12/14/2010   Hyperlipidemia    Hypertension    Myocardial infarction [facility]) nov 2010   [name]   Peripheral vascular disease (HCC)    Simvastatin-induced rhabdomyolysis    Substance abuse (HCC)    > 20 years ago, marijauna, cocaine, hash    Surgical History: Past Surgical History:  Procedure Laterality Date   CARDIAC CATHETERIZATION  12/14/2010   By Dr [name] showed a widely patent circumflex stent with mild plaque in his LAD.   CORONARY STENT PLACEMENT  10/06/2009   Successful percutaneous coronary intervention of the circumflex marginal-2 vessel with percutaneous transluminal coronary angioplasty, stenting with a 2.75 x 18 mm non-drug eluting stent Vision Stent post-dilated to 3.0 mm.    Home Medications:  Allergies as of 10/08/2022       Reactions   Other    apples   Pantoprazole Other (See Comments)   More stomach problems        Medication List        Accurate as of October 08, 2022 10:16 AM. If you have any questions, ask your nurse or doctor.           AMBULATORY NON FORMULARY MEDICATION 0.2 mLs by Intracavernosal route as needed. Medication Name: Trimix  PGE Pap 30mg  Phent 1mg    aspirin EC 81 MG tablet Take 81 mg by mouth daily.   clindamycin 300 MG capsule Commonly known as: Cleocin Take 1 capsule (300 mg total) by mouth 3 (three) times daily.   clopidogrel 75 MG tablet Commonly known as: PLAVIX Take 1 tablet (75 mg total) by mouth daily.   clotrimazole-betamethasone cream Commonly known as: LOTRISONE APPLY TO AFFECTED AREA TWICE DAILY   Co Q 10 100 MG Caps Take 1 capsule by mouth daily.   ezetimibe 10 MG tablet Commonly known as: ZETIA TAKE 1 TABLET BY MOUTH EVERY DAY   Ginger (Zingiber officinalis) 550 MG Caps Take 1 capsule by mouth daily.   Magnesium 250 MG Tabs Take 1 tablet by mouth daily.   multivitamin with minerals Tabs tablet Take 1 tablet by mouth daily.   nebivolol 5 MG tablet Commonly known as: BYSTOLIC Take 1 tablet (5 mg total) by mouth daily.   omega-3 acid ethyl esters 1 g capsule Commonly known as: Lovaza Take 2 capsules (2 g total) by mouth 2 (two) times daily.   rosuvastatin 40 MG tablet Commonly known as: CRESTOR Take 1 tablet (40 mg total) by mouth daily. What changed: how much to take  SAW PALMETTO BERRIES PO Take 1 tablet by mouth daily.   SUPER B COMPLEX PO Take 1 tablet by mouth daily.   valsartan 160 MG tablet Commonly known as: DIOVAN TAKE 1 TABLET BY MOUTH EVERY DAY   vardenafil 20 MG tablet Commonly known as: LEVITRA Take 1 tablet (20 mg total) by mouth daily as needed for erectile dysfunction.   vitamin C 1000 MG tablet Take 1,000 mg by mouth 2 (two) times daily.   vitamin E 180 MG (400 UNITS) capsule Take 400 Units by mouth daily.   Zinc 50 MG Tabs Take 1 tablet by mouth daily.        Allergies:  Allergies  Allergen Reactions   Other     apples   Pantoprazole Other (See Comments)    More stomach problems    Family History: Family  History  Problem Relation Age of Onset   Hypertension Mother    Hyperlipidemia Mother    Arthritis Mother    Hypertension Father    Hyperlipidemia Father    Cancer Father        kidney- METASTATIC   Hypertension Brother    Hyperlipidemia Brother    Hypertension Sister    Hyperlipidemia Sister    Cancer - Lung Maternal Grandmother    Cancer Maternal Grandmother        breast    Social History:  reports that he quit smoking about 15 years ago. His smoking use included cigarettes. He has a 76.00 pack-year smoking history. He quit smokeless tobacco use about 15 years ago. He reports current alcohol use of about 7.0 - 10.0 standard drinks of alcohol per week. He reports that he does not use drugs.  ROS: All other review of systems were reviewed and are negative except what is noted above in HPI  Physical Exam: BP 131/70   Pulse 73   Constitutional:  Alert and oriented, No acute distress. HEENT: [place] AT, moist mucus membranes.  Trachea midline, no masses. Cardiovascular: No clubbing, cyanosis, or edema. Respiratory: Normal respiratory effort, no increased work of breathing. GI: Abdomen is soft, nontender, nondistended, no abdominal masses GU: No CVA tenderness.  Lymph: No cervical or inguinal lymphadenopathy. Skin: No rashes, bruises or suspicious lesions. Neurologic: Grossly intact, no focal deficits, moving all 4 extremities. Psychiatric: Normal mood and affect.  Laboratory Data: Lab Results  Component Value Date   WBC 5.6 05/09/2020   HGB 15.4 05/09/2020   HCT 45.3 05/09/2020   MCV 90 05/09/2020   PLT 269 05/09/2020    Lab Results  Component Value Date   CREATININE 0.81 08/25/2020    Lab Results  Component Value Date   PSA 3.52 08/09/2020   PSA 3.0 05/20/2018   PSA 3.0 02/12/2017    Lab Results  Component Value Date   TESTOSTERONE 234 (L) 02/12/2017    Lab Results  Component Value Date   HGBA1C 5.4 08/09/2020    Urinalysis    Component Value Date/Time    APPEARANCEUR Clear 10/05/2020 1131   GLUCOSEU Negative 10/05/2020 1131   BILIRUBINUR Negative 10/05/2020 1131   PROTEINUR Negative 10/05/2020 1131   UROBILINOGEN 0.2 07/28/2012 1357   NITRITE Negative 10/05/2020 1131   LEUKOCYTESUR 1+ (A) 10/05/2020 1131    Lab Results  Component Value Date   LABMICR See below: 10/05/2020   WBCUA 11-30 (A) 04/04/2021   LABEPIT None seen 04/04/2021   BACTERIA None seen 04/04/2021    Pertinent Imaging:  No results found for this or any previous  visit.  No results found for this or any previous visit.  No results found for this or any previous visit.  No results found for this or any previous visit.  No results found for this or any previous visit.  No valid procedures specified. No results found for this or any previous visit.  No results found for this or any previous visit.   Assessment & Plan:    1. Erectile dysfunction due to arterial insufficiency -Testosterone labs today -trimix 0.6-0.66ml PRN  2. Elevated PSA -RTC 3 months with PSA   No follow-ups on file.  Wilkie Aye, MD  [facility] [place]

## 2022-10-09 ENCOUNTER — Ambulatory Visit: Payer: BC Managed Care – PPO | Admitting: Urology

## 2022-10-09 LAB — URINALYSIS, ROUTINE W REFLEX MICROSCOPIC
Bilirubin, UA: NEGATIVE
Glucose, UA: NEGATIVE
Ketones, UA: NEGATIVE
Leukocytes,UA: NEGATIVE
Nitrite, UA: NEGATIVE
Protein,UA: NEGATIVE
RBC, UA: NEGATIVE
Specific Gravity, UA: <1.005 — ABNORMAL LOW (ref 1.005–1.030)
Urobilinogen, Ur: 0.2 mg/dL (ref 0.2–1.0)
pH, UA: 5.5 (ref 5.0–7.5)

## 2022-10-12 LAB — PSA, TOTAL AND FREE
PSA, Free Pct: 25.3 %
PSA, Free: 1.19 ng/mL
Prostate Specific Ag, Serum: 4.7 ng/mL — ABNORMAL HIGH (ref 0.0–4.0)

## 2022-10-12 LAB — TESTOSTERONE,FREE AND TOTAL
Testosterone, Free: 1 pg/mL — ABNORMAL LOW (ref 6.6–18.1)
Testosterone: 139 ng/dL — ABNORMAL LOW (ref 264–916)

## 2022-10-15 ENCOUNTER — Encounter: Payer: Self-pay | Admitting: Urology

## 2022-10-15 NOTE — Patient Instructions (Signed)
Erectile Dysfunction ?Erectile dysfunction (ED) is the inability to get or keep an erection in order to have sexual intercourse. ED is considered a symptom of an underlying disorder and is not considered a disease. ED may include: ?Inability to get an erection. ?Lack of enough hardness of the erection to allow penetration. ?Loss of erection before sex is finished. ?What are the causes? ?This condition may be caused by: ?Physical causes, such as: ?Artery problems. This may include heart disease, high blood pressure, atherosclerosis, and diabetes. ?Hormonal problems, such as low testosterone. ?Obesity. ?Nerve problems. This may include back or pelvic injuries, multiple sclerosis, Parkinson's disease, spinal cord injury, and stroke. ?Certain medicines, such as: ?Pain relievers. ?Antidepressants. ?Blood pressure medicines and water pills (diuretics). ?Cancer medicines. ?Antihistamines. ?Muscle relaxants. ?Lifestyle factors, such as: ?Use of drugs such as marijuana, cocaine, or opioids. ?Excessive use of alcohol. ?Smoking. ?Lack of physical activity or exercise. ?Psychological causes, such as: ?Anxiety or stress. ?Sadness or depression. ?Exhaustion. ?Fear about sexual performance. ?Guilt. ?What are the signs or symptoms? ?Symptoms of this condition include: ?Inability to get an erection. ?Lack of enough hardness of the erection to allow penetration. ?Loss of the erection before sex is finished. ?Sometimes having normal erections, but with frequent unsatisfactory episodes. ?Low sexual satisfaction in either partner due to erection problems. ?A curved penis occurring with erection. The curve may cause pain, or the penis may be too curved to allow for intercourse. ?Never having nighttime or morning erections. ?How is this diagnosed? ?This condition is often diagnosed by: ?Performing a physical exam to find other diseases or specific problems with the penis. ?Asking you detailed questions about the problem. ?Doing tests,  such as: ?Blood tests to check for diabetes mellitus or high cholesterol, or to measure hormone levels. ?Other tests to check for underlying health conditions. ?An ultrasound exam to check for scarring. ?A test to check blood flow to the penis. ?Doing a sleep study at home to measure nighttime erections. ?How is this treated? ?This condition may be treated by: ?Medicines, such as: ?Medicine taken by mouth to help you achieve an erection (oral medicine). ?Hormone replacement therapy to replace low testosterone levels. ?Medicine that is injected into the penis. Your health care provider may instruct you how to give yourself these injections at home. ?Medicine that is delivered with a short applicator tube. The tube is inserted into the opening at the tip of the penis, which is the opening of the urethra. A tiny pellet of medicine is put in the urethra. The pellet dissolves and enhances erectile function. This is also called MUSE (medicated urethral system for erections) therapy. ?Vacuum pump. This is a pump with a ring on it. The pump and ring are placed on the penis and used to create pressure that helps the penis become erect. ?Penile implant surgery. In this procedure, you may receive: ?An inflatable implant. This consists of cylinders, a pump, and a reservoir. The cylinders can be inflated with a fluid that helps to create an erection, and they can be deflated after intercourse. ?A semi-rigid implant. This consists of two silicone rubber rods. The rods provide some rigidity. They are also flexible, so the penis can both curve downward in its normal position and become straight for sexual intercourse. ?Blood vessel surgery to improve blood flow to the penis. During this procedure, a blood vessel from a different part of the body is placed into the penis to allow blood to flow around (bypass) damaged or blocked blood vessels. ?Lifestyle changes,   such as exercising more, losing weight, and quitting smoking. ?Follow  these instructions at home: ?Medicines ? ?Take over-the-counter and prescription medicines only as told by your health care provider. Do not increase the dosage without first discussing it with your health care provider. ?If you are using self-injections, do injections as directed by your health care provider. Make sure you avoid any veins that are on the surface of the penis. After giving an injection, apply pressure to the injection site for 5 minutes. ?Talk to your health care provider about how to prevent headaches while taking ED medicines. These medicines may cause a sudden headache due to the increase in blood flow in your body. ?General instructions ?Exercise regularly, as directed by your health care provider. Work with your health care provider to lose weight, if needed. ?Do not use any products that contain nicotine or tobacco. These products include cigarettes, chewing tobacco, and vaping devices, such as e-cigarettes. If you need help quitting, ask your health care provider. ?Before using a vacuum pump, read the instructions that come with the pump and discuss any questions with your health care provider. ?Keep all follow-up visits. This is important. ?Contact a health care provider if: ?You feel nauseous. ?You are vomiting. ?You get sudden headaches while taking ED medicines. ?You have any concerns about your sexual health. ?Get help right away if: ?You are taking oral or injectable medicines and you have an erection that lasts longer than 4 hours. If your health care provider is unavailable, go to the nearest emergency room for evaluation. An erection that lasts much longer than 4 hours can result in permanent damage to your penis. ?You have severe pain in your groin or abdomen. ?You develop redness or severe swelling of your penis. ?You have redness spreading at your groin or lower abdomen. ?You are unable to urinate. ?You experience chest pain or a rapid heartbeat (palpitations) after taking oral  medicines. ?These symptoms may represent a serious problem that is an emergency. Do not wait to see if the symptoms will go away. Get medical help right away. Call your local emergency services (911 in the [place] Do not drive yourself to the hospital. ?Summary ?Erectile dysfunction (ED) is the inability to get or keep an erection during sexual intercourse. ?This condition is diagnosed based on a physical exam, your symptoms, and tests to determine the cause. Treatment varies depending on the cause and may include medicines, hormone therapy, surgery, or a vacuum pump. ?You may need follow-up visits to make sure that you are using your medicines or devices correctly. ?Get help right away if you are taking or injecting medicines and you have an erection that lasts longer than 4 hours. ?This information is not intended to replace advice given to you by your health care provider. Make sure you discuss any questions you have with your health care provider. ?Document Revised: 01/31/2021 Document Reviewed: 01/31/2021 ?Elsevier Patient Education ? 2023 Elsevier Inc[employer] ? ?

## 2022-11-13 ENCOUNTER — Other Ambulatory Visit: Payer: Self-pay | Admitting: Cardiovascular Disease

## 2022-12-19 ENCOUNTER — Other Ambulatory Visit: Payer: Self-pay | Admitting: Cardiovascular Disease

## 2022-12-23 ENCOUNTER — Telehealth: Payer: Self-pay | Admitting: Cardiovascular Disease

## 2022-12-23 NOTE — Telephone Encounter (Signed)
Patient called to schedule follow up appointment with Dr. Claiborne Billings. Informed him Dr. Claiborne Billings was completely booked and offered an appointment with a APP. He declined. Offered to add patient to the waitlist and he also declined.

## 2023-01-01 ENCOUNTER — Other Ambulatory Visit: Payer: BC Managed Care – PPO

## 2023-01-02 LAB — PSA: Prostate Specific Ag, Serum: 4.5 ng/mL — ABNORMAL HIGH (ref 0.0–4.0)

## 2023-01-08 ENCOUNTER — Encounter: Payer: Self-pay | Admitting: Urology

## 2023-01-08 ENCOUNTER — Ambulatory Visit (INDEPENDENT_AMBULATORY_CARE_PROVIDER_SITE_OTHER): Payer: BC Managed Care – PPO | Admitting: Urology

## 2023-01-08 VITALS — BP 150/76 | HR 74

## 2023-01-08 DIAGNOSIS — R972 Elevated prostate specific antigen [PSA]: Secondary | ICD-10-CM | POA: Diagnosis not present

## 2023-01-08 DIAGNOSIS — N5201 Erectile dysfunction due to arterial insufficiency: Secondary | ICD-10-CM

## 2023-01-08 LAB — URINALYSIS, ROUTINE W REFLEX MICROSCOPIC
Bilirubin, UA: NEGATIVE
Glucose, UA: NEGATIVE
Ketones, UA: NEGATIVE
Nitrite, UA: NEGATIVE
Protein,UA: NEGATIVE
RBC, UA: NEGATIVE
Specific Gravity, UA: 1.01 (ref 1.005–1.030)
Urobilinogen, Ur: 0.2 mg/dL (ref 0.2–1.0)
pH, UA: 5 (ref 5.0–7.5)

## 2023-01-08 LAB — MICROSCOPIC EXAMINATION
Bacteria, UA: NONE SEEN
RBC, Urine: NONE SEEN /hpf (ref 0–2)

## 2023-01-08 MED ORDER — LEVOFLOXACIN 750 MG PO TABS: 750.0000 mg | ORAL_TABLET | Freq: Once | ORAL | 0 refills | Status: AC

## 2023-01-08 NOTE — Progress Notes (Unsigned)
01/08/2023 9:28 AM   Jonathan [name] Jun 09, 1961 [id number]  Referring provider: No referring provider defined for this encounter.  Followup erectile dysfunction and elevated PSA   HPI: Jonathan [name] is a 61yo here for followup for erectile dysfunction and elevated PSA.  PSA stable at 4.5 but his testosterone is 139. He is on plavix managed by Dr. Tresa Endo in cardiology. He continues to have issues with fatigue and erectile dysfunction   PMH: Past Medical History:  Diagnosis Date   Abnormal stress test 2011   SEHV-EF 59%, Moderate perfusion defect due to scar, mild periinfarct   Anxiety    Coronary artery disease    Echocardiogram 2011   mild posterior wall hypokinesis   GERD (gastroesophageal reflux disease)    Heart disease    Heart murmur    History of stress test 07/16/2012   abnormal myocardial perfusion study. No significant change from 08/03/2010 study, low risk scan.   Hx of echocardiogram 12/14/2010   Hyperlipidemia    Hypertension    Myocardial infarction [facility]) nov 2010   Jonathan [name]   Peripheral vascular disease (HCC)    Simvastatin-induced rhabdomyolysis    Substance abuse (HCC)    > 20 years ago, marijauna, cocaine, hash    Surgical History: Past Surgical History:  Procedure Laterality Date   CARDIAC CATHETERIZATION  12/14/2010   By Dr Jonathan [name] showed a widely patent circumflex stent with mild plaque in his LAD.   CORONARY STENT PLACEMENT  10/06/2009   Successful percutaneous coronary intervention of the circumflex marginal-2 vessel with percutaneous transluminal coronary angioplasty, stenting with a 2.75 x 18 mm non-drug eluting stent Vision Stent post-dilated to 3.0 mm.    Home Medications:  Allergies as of 01/08/2023       Reactions   Other    apples   Pantoprazole Other (See Comments)   More stomach problems        Medication List        Accurate as of January 08, 2023  9:28 AM. If you have any questions, ask your nurse or doctor.           AMBULATORY NON FORMULARY MEDICATION 0.2 mLs by Intracavernosal route as needed. Medication Name: Trimix  PGE Pap 30mg  Phent 1mg    aspirin EC 81 MG tablet Take 81 mg by mouth daily.   clindamycin 300 MG capsule Commonly known as: Cleocin Take 1 capsule (300 mg total) by mouth 3 (three) times daily.   clopidogrel 75 MG tablet Commonly known as: PLAVIX TAKE 1 TABLET BY MOUTH EVERY DAY   clotrimazole-betamethasone cream Commonly known as: LOTRISONE APPLY TO AFFECTED AREA TWICE DAILY   Co Q 10 100 MG Caps Take 1 capsule by mouth daily.   ezetimibe 10 MG tablet Commonly known as: ZETIA Take 1 tablet (10 mg total) by mouth daily. PATIENT MUST SCHEDULE APPOINTMENT FOR FUTURE REFILLS FIRST ATTEMPT   Ginger (Zingiber officinalis) 550 MG Caps Take 1 capsule by mouth daily.   Magnesium 250 MG Tabs Take 1 tablet by mouth daily.   multivitamin with minerals Tabs tablet Take 1 tablet by mouth daily.   nebivolol 5 MG tablet Commonly known as: BYSTOLIC Take 1 tablet (5 mg total) by mouth daily.   omega-3 acid ethyl esters 1 g capsule Commonly known as: Lovaza Take 2 capsules (2 g total) by mouth 2 (two) times daily.   rosuvastatin 40 MG tablet Commonly known as: CRESTOR TAKE 1 TABLET BY MOUTH EVERY DAY   SAW PALMETTO  BERRIES PO Take 1 tablet by mouth daily.   SUPER B COMPLEX PO Take 1 tablet by mouth daily.   valsartan 160 MG tablet Commonly known as: DIOVAN TAKE 1 TABLET BY MOUTH EVERY DAY   vardenafil 20 MG tablet Commonly known as: LEVITRA Take 1 tablet (20 mg total) by mouth daily as needed for erectile dysfunction.   vitamin C 1000 MG tablet Take 1,000 mg by mouth 2 (two) times daily.   vitamin E 180 MG (400 UNITS) capsule Take 400 Units by mouth daily.   Zinc 50 MG Tabs Take 1 tablet by mouth daily.        Allergies:  Allergies  Allergen Reactions   Other     apples   Pantoprazole Other (See Comments)    More stomach problems     Family History: Family History  Problem Relation Age of Onset   Hypertension Mother    Hyperlipidemia Mother    Arthritis Mother    Hypertension Father    Hyperlipidemia Father    Cancer Father        kidney- METASTATIC   Hypertension Brother    Hyperlipidemia Brother    Hypertension Sister    Hyperlipidemia Sister    Cancer - Lung Maternal Grandmother    Cancer Maternal Grandmother        breast    Social History:  reports that he quit smoking about 16 years ago. His smoking use included cigarettes. He has a 76.00 pack-year smoking history. He quit smokeless tobacco use about 16 years ago. He reports current alcohol use of about 7.0 - 10.0 standard drinks of alcohol per week. He reports that he does not use drugs.  ROS: All other review of systems were reviewed and are negative except what is noted above in HPI  Physical Exam: BP (!) 150/76   Pulse 74   Constitutional:  Alert and oriented, No acute distress. HEENT: [place] AT, moist mucus membranes.  Trachea midline, no masses. Cardiovascular: No clubbing, cyanosis, or edema. Respiratory: Normal respiratory effort, no increased work of breathing. GI: Abdomen is soft, nontender, nondistended, no abdominal masses GU: No CVA tenderness.  Lymph: No cervical or inguinal lymphadenopathy. Skin: No rashes, bruises or suspicious lesions. Neurologic: Grossly intact, no focal deficits, moving all 4 extremities. Psychiatric: Normal mood and affect.  Laboratory Data: Lab Results  Component Value Date   WBC 5.6 05/09/2020   HGB 15.4 05/09/2020   HCT 45.3 05/09/2020   MCV 90 05/09/2020   PLT 269 05/09/2020    Lab Results  Component Value Date   CREATININE 0.81 08/25/2020    Lab Results  Component Value Date   PSA 3.52 08/09/2020   PSA 3.0 05/20/2018   PSA 3.0 02/12/2017    Lab Results  Component Value Date   TESTOSTERONE 139 (L) 10/08/2022    Lab Results  Component Value Date   HGBA1C 5.4 08/09/2020     Urinalysis    Component Value Date/Time   APPEARANCEUR Clear 10/08/2022 1203   GLUCOSEU Negative 10/08/2022 1203   BILIRUBINUR Negative 10/08/2022 1203   PROTEINUR Negative 10/08/2022 1203   UROBILINOGEN 0.2 07/28/2012 1357   NITRITE Negative 10/08/2022 1203   LEUKOCYTESUR Negative 10/08/2022 1203    Lab Results  Component Value Date   LABMICR Comment 10/08/2022   WBCUA 11-30 (A) 04/04/2021   LABEPIT None seen 04/04/2021   BACTERIA None seen 04/04/2021    Pertinent Imaging: *** No results found for this or any previous visit.  No  results found for this or any previous visit.  No results found for this or any previous visit.  No results found for this or any previous visit.  No results found for this or any previous visit.  No valid procedures specified. No results found for this or any previous visit.  No results found for this or any previous visit.   Assessment & Plan:    1. Elevated PSA The patient and I talked about etiologies of elevated PSA.  We discussed the possible relationship between elevated PSA, prostate cancer, BPH, prostatitis, and UTI.   Conservative treatment of elevated PSA with watchful waiting was discussed with the patient.  All questions were answered.        All of the risks and benefits along with alternatives to prostate biopsy were discussed with the patient.  The patient gave fully informed consent to proceed with a transrectal ultrasound guided biopsy of the prostate for the evaluation of their evated PSA.  Prostate biopsy instructions and antibiotics were given to the patient.  - Urinalysis, Routine w reflex microscopic  2. Erectile dysfunction due to arterial insufficiency -continue trimix   No follow-ups on file.  Wilkie Aye, MD  [facility] [place]

## 2023-01-08 NOTE — Patient Instructions (Addendum)
Appointment Time: February 12, 2023 Appointment Date: 12:30pm  Location: [facility]   Prostate Biopsy Instructions  Stop all aspirin or blood thinners (aspirin, plavix, coumadin, warfarin, motrin, ibuprofen, advil, aleve, naproxen, naprosyn) for 7 days prior to the procedure.  If you have any questions about stopping these medications, please contact your primary care physician or cardiologist.  Having a light meal prior to the procedure is recommended.  If you are diabetic or have low blood sugar please bring a small snack or glucose tablet.  A Fleets enema is needed to be purchased over the counter at a local pharmacy and used 2 hours before you scheduled appointment.  This can be purchased over the counter at any pharmacy.  Antibiotics will be administered in the clinic at the time of the procedure and 1 tablet has been sent to your pharmacy. Please take the antibiotic as prescribed.    Please bring someone with you to the procedure to drive you home if you are given a valium to take prior to your procedure.   If you have any questions or concerns, please feel free to call the office at [phone] or send a Mychart message.    Thank you, [facility]  Prostate-Specific Antigen Test Why am I having this test? The prostate-specific antigen (PSA) test is a screening test for prostate cancer. It can identify early signs of prostate cancer, which may allow for early detection and more effective treatment. Your health care provider may recommend that you have a PSA test starting at age 13 or that you have one earlier if you are at higher risk for prostate cancer. You may also have a PSA test: To monitor treatment of prostate cancer. To check whether prostate cancer has returned after treatment. What is being tested? This test measures the amount of PSA in your blood. PSA is a protein that is made in the prostate. The prostate naturally produces more PSA as  you age, but very high levels may be a sign of a medical condition. What kind of sample is taken?  A blood sample is required for this test. It is usually collected by inserting a needle into a blood vessel but can also be collected by sticking a finger with a small needle. Blood for this test should be drawn before having an exam of the prostate that involves digital rectal examination to avoid affecting the results. How do I prepare for this test? Do not ejaculate starting 24 hours before your test, or as long as told by your health care provider, as this can cause an elevation in PSA. Do not undergo any procedures that require manipulation of the prostate, such as biopsy or surgery, for 6 weeks before the test is done as this can cause an elevation in PSA. Tell a health care provider about: Any signs you may have of other conditions that can affect PSA levels, such as: An enlarged prostate that is not caused by cancer (benign prostatic hyperplasia, or BPH). This condition is very common in older men. A prostate or urinary tract infection. Any allergies you have. All medicines you are taking, including vitamins, herbs, eye drops, creams, and over-the-counter medicines. This also includes: Medicines to assist with hair growth, such as finasteride. Any recent exposure to a medicine called diethylstilbestrol (DES). Medicines such as male hormones (like testosterone) or other medicines that raise testosterone levels. Any bleeding problems you have. Any recent procedures you have had, especially any procedures involving  the prostate or rectum. Any medical conditions you have. How are the results reported? Your test results will be reported as a value that indicates how much PSA is in your blood. This will be given as nanograms of PSA per milliliter of blood (ng/mL). Your health care provider will compare your results to normal ranges that were established after testing a large group of people  (reference ranges). Reference ranges may vary among labs and hospitals. PSA levels vary from person to person and generally increase with age. Because of this variation, there is no single PSA value that is considered normal for everyone. Instead, PSA reference ranges are used to describe whether your PSA levels are considered low or high (elevated). Common reference ranges are: Low: 0-2.5 ng/mL. Slightly to moderately elevated: 2.6-10.0 ng/mL. Moderately elevated: 10.0-19.9 ng/mL. Significantly elevated: 20 ng/mL or greater. What do the results mean? A test result that is higher than 4 ng/mL may mean that you have prostate cancer. However, a PSA test by itself is not enough to diagnose prostate cancer. High PSA levels may also be caused by the natural aging process, prostate infection (prostatitis), or BPH. PSA screening cannot tell you if your PSA is high due to cancer or a different cause. A prostate biopsy is the only way to diagnose prostate cancer. A risk of having the PSA test is diagnosing and treating prostate cancer that would never have caused any symptoms or problems (overdiagnosis and overtreatment). Talk with your health care provider about what your results mean. In some cases, your health care provider may do more testing to confirm the results. Questions to ask your health care provider Ask your health care provider, or the department that is doing the test: When will my results be ready? How will I get my results? What are my treatment options? What other tests do I need? What are my next steps? Summary The prostate-specific antigen (PSA) test is a screening test for prostate cancer. Your health care provider may recommend that you have a PSA test starting at age 45 or that you have one earlier if you are at higher risk for prostate cancer. A test result that is higher than 4 ng/mL may mean that you have prostate cancer. However, elevated levels can be caused by a number of  conditions other than prostate cancer. Talk with your health care provider about what your results mean. This information is not intended to replace advice given to you by your health care provider. Make sure you discuss any questions you have with your health care provider. Document Revised: 03/14/2021 Document Reviewed: 03/14/2021 Elsevier Patient Education  [employer].

## 2023-01-14 ENCOUNTER — Other Ambulatory Visit: Payer: Self-pay | Admitting: Cardiovascular Disease

## 2023-01-28 ENCOUNTER — Other Ambulatory Visit: Payer: Self-pay | Admitting: Cardiovascular Disease

## 2023-02-12 ENCOUNTER — Other Ambulatory Visit: Payer: Self-pay | Admitting: Urology

## 2023-02-12 ENCOUNTER — Ambulatory Visit (HOSPITAL_BASED_OUTPATIENT_CLINIC_OR_DEPARTMENT_OTHER): Payer: BC Managed Care – PPO | Admitting: Urology

## 2023-02-12 ENCOUNTER — Ambulatory Visit (HOSPITAL_COMMUNITY)
Admission: RE | Admit: 2023-02-12 | Discharge: 2023-02-12 | Disposition: A | Discharge status: Home / Self Care | Payer: BC Managed Care – PPO | Source: Ambulatory Visit | Attending: Urology | Admitting: Urology

## 2023-02-12 DIAGNOSIS — N5201 Erectile dysfunction due to arterial insufficiency: Secondary | ICD-10-CM | POA: Insufficient documentation

## 2023-02-12 DIAGNOSIS — R972 Elevated prostate specific antigen [PSA]: Secondary | ICD-10-CM | POA: Insufficient documentation

## 2023-02-12 DIAGNOSIS — N411 Chronic prostatitis: Secondary | ICD-10-CM | POA: Diagnosis not present

## 2023-02-12 DIAGNOSIS — N410 Acute prostatitis: Secondary | ICD-10-CM | POA: Diagnosis not present

## 2023-02-12 MED ORDER — GENTAMICIN SULFATE 40 MG/ML IJ SOLN: 80.0000 mg | Freq: Once | INTRAMUSCULAR | Status: AC

## 2023-02-12 MED ORDER — LIDOCAINE HCL (PF) 2 % IJ SOLN
INTRAMUSCULAR | Status: AC
  Filled 2023-02-12: qty 10

## 2023-02-12 MED ORDER — GENTAMICIN SULFATE 40 MG/ML IJ SOLN
INTRAMUSCULAR | Status: AC
  Administered 2023-02-12: 80 mg via INTRAMUSCULAR
  Filled 2023-02-12: qty 2

## 2023-02-12 NOTE — Progress Notes (Signed)
Prostate biopsy complete no signs of distress.  

## 2023-02-12 NOTE — Patient Instructions (Signed)
Transrectal Ultrasound-Guided Prostate Biopsy, Care After What can I expect after the procedure? After the procedure, it is common to have: Pain and discomfort near your butt (rectum), especially while sitting. Pink-colored pee (urine). This is due to small amounts of blood in your pee. A burning feeling while peeing. Blood in your poop (stool). Bleeding from your butt. Blood in your semen. Follow these instructions at home: Medicines Take over-the-counter and prescription medicines only as told by your doctor. If you were given a sedative during your procedure, do not drive or use machines until your doctor says that it is safe. A sedative is a medicine that helps you relax. If you were prescribed an antibiotic medicine, take it as told by your doctor. Do not stop taking it even if you start to feel better. Activity  Return to your normal activities when your doctor says that it is safe. Ask your doctor when it is okay for you to have sex. You may have to avoid lifting. Ask your doctor how much you can safely lift. General instructions  Drink enough water to keep your pee pale yellow. Watch your pee, poop, and semen for new bleeding or bleeding that gets worse. Keep all follow-up visits. Contact a doctor if: You have any of these: Blood clots in your pee or poop. Blood in your pee more than 2 weeks after the procedure. Blood in your semen more than 2 months after the procedure. New or worse bleeding in your pee, poop, or semen. Very bad belly pain. Your pee smells bad or unusual. You have trouble peeing. Your lower belly feels firm. You have problems getting an erection. You feel like you may vomit (are nauseous), or you vomit. Get help right away if: You have a fever or chills. You have bright red pee. You have very bad pain that does not get better with medicine. You cannot pee. Summary After this procedure, it is common to have pain and discomfort near your butt,  especially while sitting. You may have blood in your pee and poop. It is common to have blood in your semen. Get help right away if you have a fever or chills. This information is not intended to replace advice given to you by your health care provider. Make sure you discuss any questions you have with your health care provider. Document Revised: 04/30/2021 Document Reviewed: 04/30/2021 Elsevier Patient Education  2023 [employer] Inc.  

## 2023-02-12 NOTE — Progress Notes (Signed)
Prostate Biopsy Procedure   Informed consent was obtained after discussing risks/benefits of the procedure.  A time out was performed to ensure correct patient identity.  Pre-Procedure: - Last PSA Level:  Lab Results  Component Value Date   PSA 3.52 08/09/2020   PSA 3.0 05/20/2018   PSA 3.0 02/12/2017   - Gentamicin given prophylactically - Levaquin 500 mg administered PO -Transrectal Ultrasound performed revealing a 84.4 gm prostate -No significant hypoechoic or median lobe noted  Procedure: - Prostate block performed using 10 cc 1% lidocaine and biopsies taken from sextant areas, a total of 12 under ultrasound guidance.  Post-Procedure: - Patient tolerated the procedure well - He was counseled to seek immediate medical attention if experiences any severe pain, significant bleeding, or fevers - Return in one week to discuss biopsy results

## 2023-02-13 ENCOUNTER — Telehealth: Payer: Self-pay

## 2023-02-13 NOTE — Telephone Encounter (Signed)
Patient called with no answer.  Detailed message left notifying patient of negative prostate biopsy. Biopsy talk cancelled and 6 month ov with psa scheduled.

## 2023-02-17 NOTE — Telephone Encounter (Signed)
Patient called wanting to proceed with testosterone injection since biopsy was negative. Please advise.

## 2023-02-25 ENCOUNTER — Other Ambulatory Visit: Payer: Self-pay | Admitting: Urology

## 2023-02-25 MED ORDER — TESTOSTERONE CYPIONATE 200 MG/ML IM SOLN: 200.0000 mg | INTRAMUSCULAR | 2 refills | Status: DC

## 2023-02-25 NOTE — Telephone Encounter (Signed)
Patient called with no answer. Detailed message left. °

## 2023-02-26 ENCOUNTER — Ambulatory Visit: Payer: BC Managed Care – PPO | Admitting: Urology

## 2023-08-07 ENCOUNTER — Other Ambulatory Visit: Payer: BC Managed Care – PPO

## 2023-08-07 DIAGNOSIS — R972 Elevated prostate specific antigen [PSA]: Secondary | ICD-10-CM

## 2023-08-08 LAB — PSA: Prostate Specific Ag, Serum: 5.7 ng/mL — ABNORMAL HIGH (ref 0.0–4.0)

## 2023-08-11 ENCOUNTER — Telehealth: Payer: Self-pay | Admitting: Urology

## 2023-08-11 ENCOUNTER — Ambulatory Visit (INDEPENDENT_AMBULATORY_CARE_PROVIDER_SITE_OTHER): Payer: BC Managed Care – PPO | Admitting: Urology

## 2023-08-11 VITALS — BP 137/77 | HR 73

## 2023-08-11 DIAGNOSIS — N5201 Erectile dysfunction due to arterial insufficiency: Secondary | ICD-10-CM

## 2023-08-11 DIAGNOSIS — R972 Elevated prostate specific antigen [PSA]: Secondary | ICD-10-CM | POA: Diagnosis not present

## 2023-08-11 NOTE — Progress Notes (Unsigned)
08/11/2023 9:52 AM   [name] 03/10/1961 [id number]  Referring provider: No referring provider defined for this encounter.  Followup elevated PSA   HPI: [name] is a 62yo here for followup for elevated PSA. Prostate biopsy 6 months ago was negative for malignancy. PSA at that time was 4.5. PSA increased to 5.7. No significant LUTS.    PMH: Past Medical History:  Diagnosis Date   Abnormal stress test 2011   SEHV-EF 59%, Moderate perfusion defect due to scar, mild periinfarct   Anxiety    Coronary artery disease    Echocardiogram 2011   mild posterior wall hypokinesis   GERD (gastroesophageal reflux disease)    Heart disease    Heart murmur    History of stress test 07/16/2012   abnormal myocardial perfusion study. No significant change from 08/03/2010 study, low risk scan.   Hx of echocardiogram 12/14/2010   Hyperlipidemia    Hypertension    Myocardial infarction [facility]) nov 2010   Jonathan [name]   Peripheral vascular disease (HCC)    Simvastatin-induced rhabdomyolysis    Substance abuse (HCC)    > 20 years ago, marijauna, cocaine, hash    Surgical History: Past Surgical History:  Procedure Laterality Date   CARDIAC CATHETERIZATION  12/14/2010   By Dr [name] showed a widely patent circumflex stent with mild plaque in his LAD.   CORONARY STENT PLACEMENT  10/06/2009   Successful percutaneous coronary intervention of the circumflex marginal-2 vessel with percutaneous transluminal coronary angioplasty, stenting with a 2.75 x 18 mm non-drug eluting stent Vision Stent post-dilated to 3.0 mm.    Home Medications:  Allergies as of 08/11/2023       Reactions   Other    apples   Pantoprazole Other (See Comments)   More stomach problems        Medication List        Accurate as of August 11, 2023  9:52 AM. If you have any questions, ask your nurse or doctor.          AMBULATORY NON FORMULARY MEDICATION 0.2 mLs by Intracavernosal route as  needed. Medication Name: Trimix  PGE Pap 30mg  Phent 1mg    aspirin EC 81 MG tablet Take 81 mg by mouth daily.   clindamycin 300 MG capsule Commonly known as: Cleocin Take 1 capsule (300 mg total) by mouth 3 (three) times daily.   clopidogrel 75 MG tablet Commonly known as: PLAVIX TAKE 1 TABLET BY MOUTH EVERY DAY   clotrimazole-betamethasone cream Commonly known as: LOTRISONE APPLY TO AFFECTED AREA TWICE DAILY   Co Q 10 100 MG Caps Take 1 capsule by mouth daily.   ezetimibe 10 MG tablet Commonly known as: ZETIA TAKE 1 TABLET BY MOUTH EVERY DAY   Fish Oil 1000 MG Cpdr Take by mouth.   Ginger (Zingiber officinalis) 550 MG Caps Take 1 capsule by mouth daily.   Magnesium 250 MG Tabs Take 1 tablet by mouth daily.   multivitamin with minerals Tabs tablet Take 1 tablet by mouth daily.   nebivolol 5 MG tablet Commonly known as: BYSTOLIC Take 1 tablet (5 mg total) by mouth daily.   omega-3 acid ethyl esters 1 g capsule Commonly known as: Lovaza Take 2 capsules (2 g total) by mouth 2 (two) times daily.   rosuvastatin 40 MG tablet Commonly known as: CRESTOR TAKE 1 TABLET BY MOUTH EVERY DAY   SAW PALMETTO BERRIES PO Take 1 tablet by mouth daily.   SUPER B COMPLEX PO  Take 1 tablet by mouth daily.   testosterone cypionate 200 MG/ML injection Commonly known as: DEPOTESTOSTERONE CYPIONATE Inject 1 mL (200 mg total) into the muscle every 14 (fourteen) days.   valsartan 160 MG tablet Commonly known as: DIOVAN TAKE 1 TABLET BY MOUTH EVERY DAY   vardenafil 20 MG tablet Commonly known as: LEVITRA Take 1 tablet (20 mg total) by mouth daily as needed for erectile dysfunction.   vitamin C 1000 MG tablet Take 1,000 mg by mouth 2 (two) times daily.   vitamin E 180 MG (400 UNITS) capsule Take 400 Units by mouth daily.   Zinc 50 MG Tabs Take 1 tablet by mouth daily.        Allergies:  Allergies  Allergen Reactions   Other     apples   Pantoprazole Other  (See Comments)    More stomach problems    Family History: Family History  Problem Relation Age of Onset   Hypertension Mother    Hyperlipidemia Mother    Arthritis Mother    Hypertension Father    Hyperlipidemia Father    Cancer Father        kidney- METASTATIC   Hypertension Brother    Hyperlipidemia Brother    Hypertension Sister    Hyperlipidemia Sister    Cancer - Lung Maternal Grandmother    Cancer Maternal Grandmother        breast    Social History:  reports that he quit smoking about 16 years ago. His smoking use included cigarettes. He started smoking about 54 years ago. He has a 76 pack-year smoking history. He quit smokeless tobacco use about 16 years ago. He reports current alcohol use of about 7.0 - 10.0 standard drinks of alcohol per week. He reports that he does not use drugs.  ROS: All other review of systems were reviewed and are negative except what is noted above in HPI  Physical Exam: BP 137/77   Pulse 73   Constitutional:  Alert and oriented, No acute distress. HEENT: [place] AT, moist mucus membranes.  Trachea midline, no masses. Cardiovascular: No clubbing, cyanosis, or edema. Respiratory: Normal respiratory effort, no increased work of breathing. GI: Abdomen is soft, nontender, nondistended, no abdominal masses GU: No CVA tenderness.  Lymph: No cervical or inguinal lymphadenopathy. Skin: No rashes, bruises or suspicious lesions. Neurologic: Grossly intact, no focal deficits, moving all 4 extremities. Psychiatric: Normal mood and affect.  Laboratory Data: Lab Results  Component Value Date   WBC 5.6 05/09/2020   HGB 15.4 05/09/2020   HCT 45.3 05/09/2020   MCV 90 05/09/2020   PLT 269 05/09/2020    Lab Results  Component Value Date   CREATININE 0.81 08/25/2020    Lab Results  Component Value Date   PSA 3.52 08/09/2020   PSA 3.0 05/20/2018   PSA 3.0 02/12/2017    Lab Results  Component Value Date   TESTOSTERONE 139 (L) 10/08/2022     Lab Results  Component Value Date   HGBA1C 5.4 08/09/2020    Urinalysis    Component Value Date/Time   APPEARANCEUR Clear 01/08/2023 0905   GLUCOSEU Negative 01/08/2023 0905   BILIRUBINUR Negative 01/08/2023 0905   PROTEINUR Negative 01/08/2023 0905   UROBILINOGEN 0.2 07/28/2012 1357   NITRITE Negative 01/08/2023 0905   LEUKOCYTESUR Trace (A) 01/08/2023 0905    Lab Results  Component Value Date   LABMICR See below: 01/08/2023   WBCUA 0-5 01/08/2023   LABEPIT 0-10 01/08/2023   BACTERIA None seen 01/08/2023  Pertinent Imaging: *** No results found for this or any previous visit.  No results found for this or any previous visit.  No results found for this or any previous visit.  No results found for this or any previous visit.  No results found for this or any previous visit.  No valid procedures specified. No results found for this or any previous visit.  No results found for this or any previous visit.   Assessment & Plan:    1. Elevated PSA ***  2. Erectile dysfunction due to arterial insufficiency ***   No follow-ups on file.  Wilkie Aye, MD  [facility] [place]

## 2023-08-11 NOTE — Telephone Encounter (Signed)
Patient needs clearance for MRI he has a stent in his heart and metal rod in finger.

## 2023-08-12 ENCOUNTER — Other Ambulatory Visit: Payer: BC Managed Care – PPO

## 2023-08-12 ENCOUNTER — Encounter: Payer: Self-pay | Admitting: Urology

## 2023-08-12 NOTE — Telephone Encounter (Signed)
I left a voicemail requesting a call back for clarification if the clearance needs to come from Cardiology and who is requesting clearance.

## 2023-08-12 NOTE — Patient Instructions (Signed)

## 2023-08-17 ENCOUNTER — Other Ambulatory Visit: Payer: Self-pay

## 2023-08-17 ENCOUNTER — Emergency Department (HOSPITAL_COMMUNITY)
Admission: EM | Admit: 2023-08-17 | Discharge: 2023-08-18 | Disposition: A | Discharge status: Home / Self Care | Payer: BC Managed Care – PPO | Attending: Emergency Medicine | Admitting: Emergency Medicine

## 2023-08-17 DIAGNOSIS — R339 Retention of urine, unspecified: Secondary | ICD-10-CM | POA: Diagnosis present

## 2023-08-17 DIAGNOSIS — I10 Essential (primary) hypertension: Secondary | ICD-10-CM | POA: Insufficient documentation

## 2023-08-17 DIAGNOSIS — Z7901 Long term (current) use of anticoagulants: Secondary | ICD-10-CM | POA: Insufficient documentation

## 2023-08-17 DIAGNOSIS — Z79899 Other long term (current) drug therapy: Secondary | ICD-10-CM | POA: Insufficient documentation

## 2023-08-17 DIAGNOSIS — I2510 Atherosclerotic heart disease of native coronary artery without angina pectoris: Secondary | ICD-10-CM | POA: Diagnosis not present

## 2023-08-17 DIAGNOSIS — Z7982 Long term (current) use of aspirin: Secondary | ICD-10-CM | POA: Diagnosis not present

## 2023-08-17 LAB — URINALYSIS, ROUTINE W REFLEX MICROSCOPIC
Bacteria, UA: NONE SEEN
Bilirubin Urine: NEGATIVE
Glucose, UA: NEGATIVE mg/dL
Ketones, ur: 5 mg/dL — AB
Nitrite: NEGATIVE
Protein, ur: NEGATIVE mg/dL
Specific Gravity, Urine: 1.013 (ref 1.005–1.030)
pH: 5 (ref 5.0–8.0)

## 2023-08-17 NOTE — ED Triage Notes (Signed)
Patient reports bladder pressure / urinary retention today , denies fever , history of prostate problems . No fever or chills .

## 2023-08-18 LAB — CBC WITH DIFFERENTIAL/PLATELET
Abs Immature Granulocytes: 0.07 10*3/uL (ref 0.00–0.07)
Basophils Absolute: 0.1 10*3/uL (ref 0.0–0.1)
Basophils Relative: 0 %
Eosinophils Absolute: 0 10*3/uL (ref 0.0–0.5)
Eosinophils Relative: 0 %
HCT: 47.5 % (ref 39.0–52.0)
Hemoglobin: 15.8 g/dL (ref 13.0–17.0)
Immature Granulocytes: 1 %
Lymphocytes Relative: 9 %
Lymphs Abs: 1.4 10*3/uL (ref 0.7–4.0)
MCH: 30 pg (ref 26.0–34.0)
MCHC: 33.3 g/dL (ref 30.0–36.0)
MCV: 90.1 fL (ref 80.0–100.0)
Monocytes Absolute: 1.2 10*3/uL — ABNORMAL HIGH (ref 0.1–1.0)
Monocytes Relative: 8 %
Neutro Abs: 12.7 10*3/uL — ABNORMAL HIGH (ref 1.7–7.7)
Neutrophils Relative %: 82 %
Platelets: 274 10*3/uL (ref 150–400)
RBC: 5.27 MIL/uL (ref 4.22–5.81)
RDW: 14.2 % (ref 11.5–15.5)
WBC: 15.3 10*3/uL — ABNORMAL HIGH (ref 4.0–10.5)
nRBC: 0 % (ref 0.0–0.2)

## 2023-08-18 LAB — COMPREHENSIVE METABOLIC PANEL
ALT: 32 U/L (ref 0–44)
AST: 32 U/L (ref 15–41)
Albumin: 3.7 g/dL (ref 3.5–5.0)
Alkaline Phosphatase: 44 U/L (ref 38–126)
Anion gap: 13 (ref 5–15)
BUN: 10 mg/dL (ref 8–23)
CO2: 21 mmol/L — ABNORMAL LOW (ref 22–32)
Calcium: 8.4 mg/dL — ABNORMAL LOW (ref 8.9–10.3)
Chloride: 99 mmol/L (ref 98–111)
Creatinine, Ser: 1.08 mg/dL (ref 0.61–1.24)
GFR, Estimated: >60 mL/min (ref >60)
Glucose, Bld: 92 mg/dL (ref 70–99)
Potassium: 4.3 mmol/L (ref 3.5–5.1)
Sodium: 133 mmol/L — ABNORMAL LOW (ref 135–145)
Total Bilirubin: 2.1 mg/dL — ABNORMAL HIGH (ref 0.3–1.2)
Total Protein: 6.2 g/dL — ABNORMAL LOW (ref 6.5–8.1)

## 2023-08-18 NOTE — Discharge Instructions (Addendum)
Leave foley in place for now. Call urology office in the morning, let them know about this ED visit.  They should schedule you to come in this week for catheter removal and voiding trial. Return here for new concerns.

## 2023-08-18 NOTE — ED Notes (Signed)
Bladder scan performed on pt. 1st scan: in bladder, 2nd scan: in bladder.

## 2023-08-18 NOTE — ED Provider Notes (Signed)
[place] [facility] AT [facility] Provider Note   CSN: [id number] Arrival date & time: 08/17/23  2236     History  Chief Complaint  Patient presents with   Urinary Retention    Jonathan [name] is a 62 y.o. male.  The history is provided by the patient and medical records.   62 year old male with history of coronary artery disease, erectile dysfunction, hypertension, hyperlipidemia, GERD, presenting to the ED with urinary retention.  States he has not had any significant urinary stream since 10 AM yesterday morning.  He has been able to urinate small amounts here and there but still feels like he is retaining a lot of urine.  He has never had issues like this before.  He is currently following with urology, Dr. Ronne Binning, for elevating PSAs.  He is due to have an MRI later this week as prior prostate biopsies were normal.  He is not having any flank pain, fever, or chills.  Home Medications Prior to Admission medications   Medication Sig Start Date End Date Taking? Authorizing Provider  AMBULATORY NON FORMULARY MEDICATION 0.2 mLs by Intracavernosal route as needed. Medication Name: Trimix  PGE Pap 30mg  Phent 1mg  10/08/22   McKenzie, Mardene Celeste, MD  Ascorbic Acid (VITAMIN C) 1000 MG tablet Take 1,000 mg by mouth 2 (two) times daily.    [provider]  aspirin EC 81 MG tablet Take 81 mg by mouth daily.    [provider]  B Complex-C (SUPER B COMPLEX PO) Take 1 tablet by mouth daily.    [provider]  clindamycin (CLEOCIN) 300 MG capsule Take 1 capsule (300 mg total) by mouth 3 (three) times daily. Patient not taking: Reported on 10/08/2022 11/18/21   Cathren Laine, MD  clopidogrel (PLAVIX) 75 MG tablet TAKE 1 TABLET BY MOUTH EVERY DAY 11/13/22   Lennette Bihari, MD  clotrimazole-betamethasone (LOTRISONE) cream APPLY TO AFFECTED AREA TWICE DAILY 02/23/18   Salley Scarlet, MD  Coenzyme Q10 (CO Q 10) 100 MG CAPS Take 1  capsule by mouth daily.    [provider]  ezetimibe (ZETIA) 10 MG tablet TAKE 1 TABLET BY MOUTH EVERY DAY 01/28/23   Lennette Bihari, MD  Ginger, Zingiber officinalis, 550 MG CAPS Take 1 capsule by mouth daily.    [provider]  Magnesium 250 MG TABS Take 1 tablet by mouth daily.    [provider]  Multiple Vitamin (MULITIVITAMIN WITH MINERALS) TABS Take 1 tablet by mouth daily.    [provider]  nebivolol (BYSTOLIC) 5 MG tablet Take 1 tablet (5 mg total) by mouth daily. 05/09/20   Jodelle Gross, NP  omega-3 acid ethyl esters (LOVAZA) 1 g capsule Take 2 capsules (2 g total) by mouth 2 (two) times daily. Patient not taking: Reported on 10/08/2022 08/11/20   Salley Scarlet, MD  Omega-3 Fatty Acids (FISH OIL) 1000 MG CPDR Take by mouth.    [provider]  rosuvastatin (CRESTOR) 40 MG tablet TAKE 1 TABLET BY MOUTH EVERY DAY 11/13/22   Lennette Bihari, MD  Saw Palmetto, Serenoa repens, (SAW PALMETTO BERRIES PO) Take 1 tablet by mouth daily.    [provider]  testosterone cypionate (DEPOTESTOSTERONE CYPIONATE) 200 MG/ML injection Inject 1 mL (200 mg total) into the muscle every 14 (fourteen) days. 02/25/23   McKenzie, Mardene Celeste, MD  valsartan (DIOVAN) 160 MG tablet TAKE 1 TABLET BY MOUTH EVERY DAY 05/15/22   Nicki Guadalajara  A, MD  vardenafil (LEVITRA) 20 MG tablet Take 1 tablet (20 mg total) by mouth daily as needed for erectile dysfunction. Patient not taking: Reported on 10/08/2022 04/04/21   Malen Gauze, MD  vitamin E 400 UNIT capsule Take 400 Units by mouth daily.    [provider]  Zinc 50 MG TABS Take 1 tablet by mouth daily.    [provider]      Allergies    Other and Pantoprazole    Review of Systems   Review of Systems  Genitourinary:  Positive for difficulty urinating.  All other systems reviewed and are negative.   Physical Exam Updated Vital Signs BP (!) 155/68   Pulse 76   Temp 98.4 F  (36.9 C)   Resp 17   SpO2 100%   Physical Exam Vitals and nursing note reviewed.  Constitutional:      Appearance: He is well-developed.  HENT:     Head: Normocephalic and atraumatic.  Eyes:     Conjunctiva/sclera: Conjunctivae normal.     Pupils: Pupils are equal, round, and reactive to light.  Cardiovascular:     Rate and Rhythm: Normal rate and regular rhythm.     Heart sounds: Normal heart sounds.  Pulmonary:     Effort: Pulmonary effort is normal. No respiratory distress.     Breath sounds: Normal breath sounds. No rhonchi.  Abdominal:     General: Bowel sounds are normal.     Palpations: Abdomen is soft.     Tenderness: There is no abdominal tenderness. There is no rebound.     Comments: Bladder distended, continues trying to force urinate into urinal  Musculoskeletal:        General: Normal range of motion.     Cervical back: Normal range of motion.  Skin:    General: Skin is warm and dry.  Neurological:     Mental Status: He is alert and oriented to person, place, and time.     ED Results / Procedures / Treatments   Labs (all labs ordered are listed, but only abnormal results are displayed) Labs Reviewed  URINALYSIS, ROUTINE W REFLEX MICROSCOPIC - Abnormal; Notable for the following components:      Result Value   APPearance HAZY (*)    Hgb urine dipstick MODERATE (*)    Ketones, ur 5 (*)    Leukocytes,Ua SMALL (*)    All other components within normal limits  CBC WITH DIFFERENTIAL/PLATELET - Abnormal; Notable for the following components:   WBC 15.3 (*)    Neutro Abs 12.7 (*)    Monocytes Absolute 1.2 (*)    All other components within normal limits  COMPREHENSIVE METABOLIC PANEL - Abnormal; Notable for the following components:   Sodium 133 (*)    CO2 21 (*)    Calcium 8.4 (*)    Total Protein 6.2 (*)    Total Bilirubin 2.1 (*)    All other components within normal limits    EKG None  Radiology No results found.  Procedures Procedures     Medications Ordered in ED Medications - No data to display  ED Course/ Medical Decision Making/ A&P                                 Medical Decision Making Amount and/or Complexity of Data Reviewed Labs: ordered. ECG/medicine tests: ordered and independent interpretation performed.   62 y.o. M here with  urinary retention.  Has been able to pass small amounts of urine but no meaningful urinary stream since yesterday around 10 AM.  Continues trying to strain and urinal during exam.  Bladder does appear distended.  Bladder scan was performed, 800+ noted.  Denies any history of same, however does report recent increasing PSA numbers.  He follows with urology, Dr. Ronne Binning.  Labs obtained today and reviewed, normal renal function.  Does have leukocytosis but no fever, tachycardia, or other SIRS criteria.  UA sent without signs of infection.  Foley catheter placed, drained out >1 liter.  Patient feeling significantly better.  Will leave Foley in place and have him follow-up in urology clinic later this week for voiding trial.  He will call this morning to get this scheduled.  Return here for any new or acute changes.  Final Clinical Impression(s) / ED Diagnoses Final diagnoses:  Urinary retention    Rx / DC Orders ED Discharge Orders     None         Garlon Hatchet, PA-C 08/18/23 1610    Nira Conn, MD 08/18/23 [phone]

## 2023-08-18 NOTE — ED Notes (Signed)
Pt in NAD at d/c from ED. A&O. Ambulatory. Respirations even & unlabored. Skin warm & dry. Pt transitioned to leg bag. Pt instructed on use and catheter care, pt & family verbalized understanding. Pt verbalized understanding of d/c teaching including follow up care and reasons to return to the ED. No needs or questions expressed at d/c.

## 2023-08-19 ENCOUNTER — Telehealth: Payer: Self-pay

## 2023-08-19 NOTE — Telephone Encounter (Signed)
Tried calling patient with no answer, left detail message making patient aware about Flomax or Alfuzosin

## 2023-08-19 NOTE — Telephone Encounter (Signed)
-----   Message from Django Nguyen Dahlstedt sent at 08/19/2023 10:39 AM EDT ----- This is a Dr. Ronne Binning.  On to see me tomorrow for urinary retention.  He was seen in the emergency department.  He has a catheter.  It does not seem like he is on Flomax or alfuzosin.  Please call if it is not only 1 of these medications, he needs to be started on it, and may be put on the schedule later this week for a voiding trial.

## 2023-08-19 NOTE — Telephone Encounter (Signed)
Tried calling patient for clarification on clearance and who is requesting clearance. Left voice message for patient to return call to office.

## 2023-08-19 NOTE — Telephone Encounter (Signed)
Patient states that he spoke with someone at Radiology that made him aware the stent in his heart will not be a problem to perform the MRI.

## 2023-08-19 NOTE — Telephone Encounter (Signed)
Patient return call. Patient is not on either Flomax or Alfusozin. Patient states he is scheduled for prostate biopsy 10/03 and wants to know if the catheter will intefer with the prostate biopsy.

## 2023-08-19 NOTE — Telephone Encounter (Signed)
-----   Message from Django Nguyen Dahlstedt sent at 08/19/2023 10:39 AM EDT ----- This is a Dr. Ronne [name].  On to see me tomorrow for urinary retention.  He was seen in the emergency department.  He has a catheter.  It does not seem like he is on Flomax or alfuzosin.  Please call if it is not only 1 of these medications, he needs to be started on it, and may be put on the schedule later this week for a voiding trial.

## 2023-08-20 ENCOUNTER — Telehealth: Payer: Self-pay

## 2023-08-20 ENCOUNTER — Ambulatory Visit (INDEPENDENT_AMBULATORY_CARE_PROVIDER_SITE_OTHER): Payer: BC Managed Care – PPO | Admitting: Urology

## 2023-08-20 ENCOUNTER — Encounter: Payer: Self-pay | Admitting: Urology

## 2023-08-20 VITALS — BP 171/77 | HR 84

## 2023-08-20 DIAGNOSIS — R339 Retention of urine, unspecified: Secondary | ICD-10-CM | POA: Diagnosis not present

## 2023-08-20 DIAGNOSIS — N401 Enlarged prostate with lower urinary tract symptoms: Secondary | ICD-10-CM

## 2023-08-20 DIAGNOSIS — N138 Other obstructive and reflux uropathy: Secondary | ICD-10-CM

## 2023-08-20 NOTE — Progress Notes (Signed)
History of Present Illness: 62 year old male comes in today with urinary retention.  Patient of Dr. Dimas Millin.  Has had ultrasound and biopsy of his prostate for elevated PSA a few months ago with negative findings.  He is scheduled for prostate MRI tomorrow.  At the time of his biopsy in April, prostate volume was 84 g.  He is not on any BPH medications.  He presented with urinary retention phimosis: On September 30.  Well over 1 L of urine was obtained.  Urine was not indicative of urinary tract infection.  He was not given an alpha-blocker at the time of this emergency room visit.  Past Medical History:  Diagnosis Date   Abnormal stress test 2011   SEHV-EF 59%, Moderate perfusion defect due to scar, mild periinfarct   Anxiety    Coronary artery disease    Echocardiogram 2011   mild posterior wall hypokinesis   GERD (gastroesophageal reflux disease)    Heart disease    Heart murmur    History of stress test 07/16/2012   abnormal myocardial perfusion study. No significant change from 08/03/2010 study, low risk scan.   Hx of echocardiogram 12/14/2010   Hyperlipidemia    Hypertension    Myocardial infarction [facility]) nov 2010   [name]   Peripheral vascular disease (HCC)    Simvastatin-induced rhabdomyolysis    Substance abuse (HCC)    > 20 years ago, marijauna, cocaine, hash    Past Surgical History:  Procedure Laterality Date   CARDIAC CATHETERIZATION  12/14/2010   By Dr [name] showed a widely patent circumflex stent with mild plaque in his LAD.   CORONARY STENT PLACEMENT  10/06/2009   Successful percutaneous coronary intervention of the circumflex marginal-2 vessel with percutaneous transluminal coronary angioplasty, stenting with a 2.75 x 18 mm non-drug eluting stent Vision Stent post-dilated to 3.0 mm.    Home Medications:  Allergies as of 08/20/2023       Reactions   Other    Apples - gi upset   Pantoprazole Other (See Comments)   More stomach problems   Simvastatin  Other (See Comments)   myalgia        Medication List        Accurate as of August 20, 2023  8:42 AM. If you have any questions, ask your nurse or doctor.          aspirin EC 81 MG tablet Take 81 mg by mouth daily.   clopidogrel 75 MG tablet Commonly known as: PLAVIX TAKE 1 TABLET BY MOUTH EVERY DAY   Co Q 10 100 MG Caps Take 1 capsule by mouth daily.   ezetimibe 10 MG tablet Commonly known as: ZETIA TAKE 1 TABLET BY MOUTH EVERY DAY   Fish Oil 1000 MG Cpdr Take 1,000 mg by mouth in the morning and at bedtime.   Ginger (Zingiber officinalis) 550 MG Caps Take 1 capsule by mouth daily.   Magnesium 250 MG Tabs Take 1 tablet by mouth daily.   multivitamin with minerals Tabs tablet Take 1 tablet by mouth daily.   nebivolol 5 MG tablet Commonly known as: BYSTOLIC Take 1 tablet (5 mg total) by mouth daily.   rosuvastatin 40 MG tablet Commonly known as: CRESTOR TAKE 1 TABLET BY MOUTH EVERY DAY   SUPER B COMPLEX PO Take 1 tablet by mouth daily.   testosterone cypionate 200 MG/ML injection Commonly known as: DEPOTESTOSTERONE CYPIONATE Inject 1 mL (200 mg total) into the muscle every 14 (fourteen) days.  valsartan 160 MG tablet Commonly known as: DIOVAN TAKE 1 TABLET BY MOUTH EVERY DAY   vitamin C 1000 MG tablet Take 1,000 mg by mouth 2 (two) times daily.   vitamin E 180 MG (400 UNITS) capsule Take 400 Units by mouth daily.   Zinc 50 MG Tabs Take 1 tablet by mouth daily.        Allergies:  Allergies  Allergen Reactions   Other     Apples - gi upset   Pantoprazole Other (See Comments)    More stomach problems   Simvastatin Other (See Comments)    myalgia    Family History  Problem Relation Age of Onset   Hypertension Mother    Hyperlipidemia Mother    Arthritis Mother    Hypertension Father    Hyperlipidemia Father    Cancer Father        kidney- METASTATIC   Hypertension Brother    Hyperlipidemia Brother    Hypertension Sister     Hyperlipidemia Sister    Cancer - Lung Maternal Grandmother    Cancer Maternal Grandmother        breast    Social History:  reports that he quit smoking about 16 years ago. His smoking use included cigarettes. He started smoking about 54 years ago. He has a 76 pack-year smoking history. He quit smokeless tobacco use about 16 years ago. He reports current alcohol use of about 7.0 - 10.0 standard drinks of alcohol per week. He reports that he does not use drugs.  ROS: A complete review of systems was performed.  All systems are negative except for pertinent findings as noted.  Physical Exam:  Vital signs in last 24 hours: There were no vitals taken for this visit. Constitutional:  Alert and oriented, No acute distress Cardiovascular: Regular rate  Respiratory: Normal respiratory effort Neurologic: Grossly intact, no focal deficits Psychiatric: Normal mood and affect  I have reviewed prior pt notes  I have reviewed notes from the emergency  I have reviewed urinalysis results from the emergency room  I have independently reviewed prior imaging--prostate ultrasound volume reviewed  I have reviewed prior PSA and pathology results    Impression/Assessment:  1.  History of elevated PSA with negative biopsy in March, MRI of the prostate pending for tomorrow  2.  BPH with retention  Plan:  1.  I told the patient that I think it is fine to have his MRI tomorrow  2.  I put him on alfuzosin  3.  I will have him come back in about 5 days for voiding trial

## 2023-08-20 NOTE — Telephone Encounter (Signed)
Tried calling patient with no answer. Left voiced message making patient aware to keep his scheduled appointment today.

## 2023-08-21 ENCOUNTER — Telehealth: Payer: Self-pay | Admitting: Urology

## 2023-08-21 ENCOUNTER — Ambulatory Visit (HOSPITAL_COMMUNITY)
Admission: RE | Admit: 2023-08-21 | Discharge: 2023-08-21 | Disposition: A | Discharge status: Home / Self Care | Payer: BC Managed Care – PPO | Source: Ambulatory Visit | Attending: Urology | Admitting: Urology

## 2023-08-21 DIAGNOSIS — R972 Elevated prostate specific antigen [PSA]: Secondary | ICD-10-CM | POA: Diagnosis present

## 2023-08-21 MED ORDER — GADOBUTROL 1 MMOL/ML IV SOLN
10.0000 mL | Freq: Once | INTRAVENOUS | Status: AC | PRN
  Administered 2023-08-21: 10 mL via INTRAVENOUS

## 2023-08-21 MED ORDER — ALFUZOSIN HCL ER 10 MG PO TB24
10.0000 mg | ORAL_TABLET | Freq: Every day | ORAL | 11 refills | Status: DC
Start: 2023-08-21 — End: 2023-09-02

## 2023-08-21 NOTE — Telephone Encounter (Signed)
Patient called he was to have alfuzosin  called into the pharmacy yesterday , they do not have it , does not look like medication was sent over.  Please correct  thank you

## 2023-08-21 NOTE — Telephone Encounter (Signed)
Patient is made aware Rx sent to pharmacy. Patient voiced understanding.

## 2023-08-26 ENCOUNTER — Ambulatory Visit (INDEPENDENT_AMBULATORY_CARE_PROVIDER_SITE_OTHER): Payer: BC Managed Care – PPO

## 2023-08-26 DIAGNOSIS — E291 Testicular hypofunction: Secondary | ICD-10-CM | POA: Insufficient documentation

## 2023-08-26 DIAGNOSIS — N401 Enlarged prostate with lower urinary tract symptoms: Secondary | ICD-10-CM | POA: Diagnosis not present

## 2023-08-26 DIAGNOSIS — N138 Other obstructive and reflux uropathy: Secondary | ICD-10-CM | POA: Insufficient documentation

## 2023-08-26 DIAGNOSIS — R339 Retention of urine, unspecified: Secondary | ICD-10-CM | POA: Insufficient documentation

## 2023-08-26 DIAGNOSIS — R972 Elevated prostate specific antigen [PSA]: Secondary | ICD-10-CM | POA: Insufficient documentation

## 2023-08-26 MED ORDER — CIPROFLOXACIN HCL 500 MG PO TABS
500.0000 mg | ORAL_TABLET | Freq: Once | ORAL | Status: AC
Start: 2023-08-26 — End: 2023-08-26
  Administered 2023-08-26: 500 mg via ORAL

## 2023-08-26 NOTE — Progress Notes (Signed)
post void residual =738ml  Simple Catheter Placement  Due to urinary retention patient is present today for a foley cath placement.  Patient was cleaned and prepped in a sterile fashion with betadine and 2% lidocaine jelly was instilled into the urethra. A 16 FR foley catheter was inserted, urine return was noted  , urine was yellow in color.  The balloon was filled with 10cc of sterile water.  A leg bag was attached for drainage.  Patient was given instruction on proper catheter care.  Patient tolerated well, no complications were noted   Performed by: Guss Bunde, CMA  Additional notes/ Follow up: pt will follow up with Sarah on 10/15 for VT

## 2023-08-26 NOTE — Progress Notes (Signed)
Fill and Pull Catheter Removal  Patient is present today for a catheter removal.  Patient was cleaned and prepped in a sterile fashion of sterile water/ saline was instilled into the bladder when the patient felt the urge to urinate. 10ml of water was then drained from the balloon.  A 16FR foley cath was removed from the bladder no complications were noted .  Patient as then given some time to void on their own.  Patient cannot void on their own after some time.  Patient tolerated well.  Performed by: Guss Bunde, CMA  Follow up/ Additional notes: Because of patient work schedule he will wait in office for a few hours before scanning his bladder.

## 2023-08-26 NOTE — Progress Notes (Addendum)
Name: Jonathan [name] DOB: 1960/12/10 MRN: [id number]  History of Present Illness: Mr. Jonathan [name] is a 62 y.o. male who presents today for follow up visit at [name]. - GU history: 1. BPH. 2. Elevated PSA.  - 02/12/2023: Underwent TRUS and prostate biopsy; pathology was benign. 3. Hypogonadism.  - He does testosterone replacement therapy with depo-testosterone IM 200 mg/mL once every 14 days. 4. Erectile dysfunction.  - Failed Viagra, Cialis, Edex. - Uses Trimix for intracorporeal injections.  PSA values: - 05/20/2018: 3.0 - 08/09/2020: 3.52 - 10/08/2022: 4.7 - 01/01/2023: 4.5 - 08/07/2023: 5.7  Recent history:  > 08/17/2023: Seen in ER for urinary retention. PVR >800 ml. No acute lab findings. Foley catheter placed with >1000 ml returned.  > 08/20/2023: Seen by Dr. Retta Diones. Alfuzosin prescribed.  > 08/21/2023: MRI prostate showed: 1. PI-RADS category 3 lesion of the right posterolateral peripheral zone at the apex. Targeting data sent to [facility]. 2. Prostatomegaly and benign prostatic hypertrophy.  > 08/26/2023: Nurse visit. PVR = 710 ml. Foley catheter placed for persistent urinary retention.  Today: He reports the catheter has been draining well.  He denies flank pain or abdominal pain. He denies fevers, nausea, or vomiting. Denies constipation.   Fall Screening: Do you usually have a device to assist in your mobility? No   Medications: Current Outpatient Medications  Medication Sig Dispense Refill   Ascorbic Acid (VITAMIN C) 1000 MG tablet Take 1,000 mg by mouth 2 (two) times daily.     aspirin EC 81 MG tablet Take 81 mg by mouth daily.     B Complex-C (SUPER B COMPLEX PO) Take 1 tablet by mouth daily.     clopidogrel (PLAVIX) 75 MG tablet TAKE 1 TABLET BY MOUTH EVERY DAY 90 tablet 0   Coenzyme Q10 (CO Q 10) 100 MG CAPS Take 1 capsule by mouth daily.     ezetimibe (ZETIA) 10 MG tablet TAKE 1 TABLET BY MOUTH EVERY DAY 15 tablet 0    Ginger, Zingiber officinalis, 550 MG CAPS Take 1 capsule by mouth daily.     Magnesium 250 MG TABS Take 1 tablet by mouth daily.     Multiple Vitamin (MULITIVITAMIN WITH MINERALS) TABS Take 1 tablet by mouth daily.     nebivolol (BYSTOLIC) 5 MG tablet Take 1 tablet (5 mg total) by mouth daily. 90 tablet 3   Omega-3 Fatty Acids (FISH OIL) 1000 MG CPDR Take 1,000 mg by mouth in the morning and at bedtime.     rosuvastatin (CRESTOR) 40 MG tablet TAKE 1 TABLET BY MOUTH EVERY DAY 90 tablet 0   silodosin (RAPAFLO) 8 MG CAPS capsule Take 1 capsule (8 mg total) by mouth daily with breakfast. 30 capsule 2   testosterone cypionate (DEPOTESTOSTERONE CYPIONATE) 200 MG/ML injection Inject 1 mL (200 mg total) into the muscle every 14 (fourteen) days. 10 mL 2   valsartan (DIOVAN) 160 MG tablet TAKE 1 TABLET BY MOUTH EVERY DAY 90 tablet 3   vitamin E 400 UNIT capsule Take 400 Units by mouth daily.     Zinc 50 MG TABS Take 1 tablet by mouth daily.     No current [name]-administered medications for this visit.    Allergies: Allergies  Allergen Reactions   Other     Apples - gi upset   Pantoprazole Other (See Comments)    More stomach problems   Simvastatin Other (See Comments)    myalgia    Past Medical History:  Diagnosis Date  Abnormal stress test 2011   SEHV-EF 59%, Moderate perfusion defect due to scar, mild periinfarct   Anxiety    Coronary artery disease    Echocardiogram 2011   mild posterior wall hypokinesis   GERD (gastroesophageal reflux disease)    Heart disease    Heart murmur    History of stress test 07/16/2012   abnormal myocardial perfusion study. No significant change from 08/03/2010 study, low risk scan.   Hx of echocardiogram 12/14/2010   Hyperlipidemia    Hypertension    Myocardial infarction [name]) nov 2010   Jonathan [name]   Peripheral vascular disease (HCC)    Simvastatin-induced rhabdomyolysis    Substance abuse (HCC)    > 20 years ago, marijauna, cocaine, hash    Past Surgical History:  Procedure Laterality Date   CARDIAC CATHETERIZATION  12/14/2010   By Dr [name] showed a widely patent circumflex stent with mild plaque in his LAD.   CORONARY STENT PLACEMENT  10/06/2009   Successful percutaneous coronary intervention of the circumflex marginal-2 vessel with percutaneous transluminal coronary angioplasty, stenting with a 2.75 x 18 mm non-drug eluting stent Vision Stent post-dilated to 3.0 mm.   Family History  Problem Relation Age of Onset   Hypertension Mother    Hyperlipidemia Mother    Arthritis Mother    Hypertension Father    Hyperlipidemia Father    Cancer Father        kidney- METASTATIC   Hypertension Brother    Hyperlipidemia Brother    Hypertension Sister    Hyperlipidemia Sister    Cancer - Lung Maternal Grandmother    Cancer Maternal Grandmother        breast   Social History   Socioeconomic History   Marital status: Married    Spouse name: Not on file   Number of children: 3   Years of education: Not on file   Highest education level: Not on file  Occupational History   Occupation: truck driver  Tobacco Use   Smoking status: Former    Current packs/day: 0.00    Average packs/day: 2.0 packs/day for 38.0 years (76.0 ttl pk-yrs)    Types: Cigarettes    Start date: 01/07/1969    Quit date: 01/07/2007    Years since quitting: 16.6   Smokeless tobacco: Former    Quit date: 2008  Substance and Sexual Activity   Alcohol use: Yes    Alcohol/week: 7.0 - 10.0 standard drinks of alcohol    Types: 7 - 10 Standard drinks or equivalent per week   Drug use: No   Sexual activity: Not on file  Other Topics Concern   Not on file  Social History Narrative   ** Merged History Encounter **       Social Determinants of Health   Financial Resource Strain: Not on file  Food Insecurity: Not on file  Transportation Needs: Not on file  Physical Activity: Not on file  Stress: Not on file  Social Connections: Unknown  (04/01/2022)   Received from [name], [facility]   Social Network    Social Network: Not on file  Intimate Partner Violence: Unknown (02/21/2022)   Received from [name], [facility]   HITS    Physically Hurt: Not on file    Insult or Talk Down To: Not on file    Threaten Physical Harm: Not on file    Scream or Curse: Not on file    Review of Systems Constitutional: Patient denies any unintentional weight  loss or change in strength lntegumentary: Patient denies any rashes or pruritus Cardiovascular: Patient denies chest pain or syncope Respiratory: Patient denies shortness of breath Gastrointestinal: Patient denies nausea, vomiting, constipation, or diarrhea Musculoskeletal: Patient denies muscle cramps or weakness Neurologic: Patient denies convulsions or seizures Allergic/Immunologic: Patient denies recent allergic reaction(s) Hematologic/Lymphatic: Patient denies bleeding tendencies Endocrine: Patient denies heat/cold intolerance  GU: As per HPI.  OBJECTIVE Vitals:   09/02/23 0903  BP: (!) 145/79  Pulse: 90  Temp: 98.8 F (37.1 C)   Body mass index is 33.09 kg/m.  Physical Examination Constitutional: No obvious distress; patient is non-toxic appearing  Cardiovascular: No visible lower extremity edema.  Respiratory: The patient does not have audible wheezing/stridor; respirations do not appear labored  Gastrointestinal: Abdomen non-distended Musculoskeletal: Normal ROM of UEs  Skin: No obvious rashes/open sores  Neurologic: CN 2-12 grossly intact Psychiatric: Answered questions appropriately with normal affect  Hematologic/Lymphatic/Immunologic: No obvious bruises or sites of spontaneous bleeding   ASSESSMENT Benign prostatic hyperplasia with urinary obstruction - Plan: Bladder Voiding Trial, ciprofloxacin (CIPRO) tablet 500 mg, silodosin (RAPAFLO) 8 MG CAPS capsule  Urinary retention - Plan: silodosin (RAPAFLO) 8 MG CAPS capsule  Foley catheter  in place  Elevated PSA  Hypogonadism in male  We discussed his persistent incomplete bladder emptying, which is most likely due to BPH with bladder outlet obstruction. Patient failed repeat voiding trial today with PVR >200 ml. He was advised that Dr. Ronne Binning was consulted prior to today's office visit and recommended switching from Alfuzosin (Uroxatral) to Silodosin (Rapaflo) 8 mg day, which is somewhat stronger. Patient elected to do that and to switch from indwelling Foley catheter to clean intermittent catheterization (CIC) with Coude catheter due to his BPH with BOO. He was advised to start out doing CIC 4x/day after voiding; may decrease to 3x/day if I&O cath PVR is <100 ml. Advised cystoscopy for further evaluation / surgical consultation for a potential bladder outlet procedure.   We also reviewed discussed indeterminate finding on recent MRI prostate. Discussed ongoing PSA surveillance with possible future MRI-fusion transperineal prostate biopsy in the OR if his PSA values continues to rise. PSA recheck deferred today due to catheter use (may cause artificial transient PSA elevation).  Pt verbalized understanding and agreement. All questions were answered.   PLAN Advised the following: 1. Discontinue Alfuzosin (Uroxatral). Start Silodosin (Rapaflo) 8 mg daily. 2. Foley catheter discontinued. Start clean intermittent catheterization (CIC) 4x/day after voiding; may decrease to 3x/day if I&O cath PVR is <100 ml.  3. Return for 1st available cystoscopy with either Dr. Retta Diones or Dr. Ronne Binning.  Orders Placed This Encounter  Procedures   Bladder Voiding Trial   Total time spent caring for the patient today was over 30 minutes. This includes time spent on the date of the visit reviewing the patient's chart before the visit, time spent during the visit, and time spent after the visit on documentation. Over 50% of that time was spent in face-to-face time with this patient for direct  counseling. E&M based on time and complexity of medical decision making.  It has been explained that the patient is to follow regularly with their PCP in addition to all other providers involved in their care and to follow instructions provided by these respective offices. Patient advised to contact urology clinic if any urologic-pertaining questions, concerns, new symptoms or problems arise in the interim period.  There are no Patient Instructions on file for this visit.  Electronically signed by:  Donnita Falls,  FNP   09/02/23    5:45 PM

## 2023-09-02 ENCOUNTER — Encounter: Payer: Self-pay | Admitting: Urology

## 2023-09-02 ENCOUNTER — Telehealth: Payer: Self-pay

## 2023-09-02 ENCOUNTER — Ambulatory Visit (INDEPENDENT_AMBULATORY_CARE_PROVIDER_SITE_OTHER): Payer: BC Managed Care – PPO | Admitting: Urology

## 2023-09-02 VITALS — BP 145/79 | HR 90 | Temp 98.8°F | Wt 230.6 lb

## 2023-09-02 DIAGNOSIS — R972 Elevated prostate specific antigen [PSA]: Secondary | ICD-10-CM

## 2023-09-02 DIAGNOSIS — N138 Other obstructive and reflux uropathy: Secondary | ICD-10-CM | POA: Diagnosis not present

## 2023-09-02 DIAGNOSIS — N401 Enlarged prostate with lower urinary tract symptoms: Secondary | ICD-10-CM

## 2023-09-02 DIAGNOSIS — R339 Retention of urine, unspecified: Secondary | ICD-10-CM | POA: Diagnosis not present

## 2023-09-02 DIAGNOSIS — Z978 Presence of other specified devices: Secondary | ICD-10-CM

## 2023-09-02 DIAGNOSIS — E291 Testicular hypofunction: Secondary | ICD-10-CM

## 2023-09-02 MED ORDER — CIPROFLOXACIN HCL 500 MG PO TABS
500.0000 mg | ORAL_TABLET | Freq: Once | ORAL | Status: AC
Start: 2023-09-02 — End: 2023-09-02
  Administered 2023-09-02: 500 mg via ORAL

## 2023-09-02 MED ORDER — SILODOSIN 8 MG PO CAPS
8.0000 mg | ORAL_CAPSULE | Freq: Every day | ORAL | 2 refills | Status: DC
Start: 2023-09-02 — End: 2023-10-15

## 2023-09-02 NOTE — Progress Notes (Addendum)
Fill and Pull Catheter Removal  Patient is present today for a catheter removal.  Patient was cleaned and prepped in a sterile fashion of sterile water/ saline was instilled into the bladder when the patient felt the urge to urinate. 10ml of water was then drained from the balloon.  A 16FR foley cath was removed from the bladder no complications were noted .  Patient as then given some time to void on their own.  Patient can void  100 ml on their own after some time.  Patient tolerated well.  Performed by: Guss Bunde, CMA  Follow up/ Additional notes: NP to see after    Continuous Intermittent Catheterization  Due to urinary retention patient is present today for a teaching of self I & O Catheterization. Patient was given detailed verbal and printed instructions of self catheterization. Patient was cleaned and prepped in a sterile fashion.  With instruction and assistance patient inserted a 14FR coude and urine return was noted 200 ml, urine was yellow in color. Patient tolerated well, no complications were noted Patient was given a sample bag with supplies to take home.  Instructions were given per Evette Georges, FNP for patient to cath 4 times daily.  An order was placed with [employer] [employer] for catheters to be sent to the patient's home. Patient is to follow up as scheduled.   Performed by: Donney Dice

## 2023-09-02 NOTE — Telephone Encounter (Signed)
Catheter supply company called and needs coude catheter indication added to the office note to have insurance approval.  Message sent to provider.

## 2023-09-05 ENCOUNTER — Other Ambulatory Visit: Payer: Self-pay

## 2023-09-05 ENCOUNTER — Encounter (HOSPITAL_COMMUNITY): Payer: Self-pay | Admitting: Emergency Medicine

## 2023-09-05 ENCOUNTER — Emergency Department (HOSPITAL_COMMUNITY)
Admission: EM | Admit: 2023-09-05 | Discharge: 2023-09-06 | Disposition: A | Discharge status: Home / Self Care | Payer: BC Managed Care – PPO | Attending: Emergency Medicine | Admitting: Emergency Medicine

## 2023-09-05 DIAGNOSIS — Z7902 Long term (current) use of antithrombotics/antiplatelets: Secondary | ICD-10-CM | POA: Diagnosis not present

## 2023-09-05 DIAGNOSIS — Z7982 Long term (current) use of aspirin: Secondary | ICD-10-CM | POA: Diagnosis not present

## 2023-09-05 DIAGNOSIS — R319 Hematuria, unspecified: Secondary | ICD-10-CM | POA: Diagnosis present

## 2023-09-05 DIAGNOSIS — R350 Frequency of micturition: Secondary | ICD-10-CM | POA: Insufficient documentation

## 2023-09-05 NOTE — ED Provider Notes (Incomplete)
Jonathan [name]   CSN: [id number] Arrival date & time: 09/05/23  2312     History {Add pertinent medical, surgical, social history, OB history to HPI:1} Chief Complaint  Patient presents with  . Urinary Retention    Jonathan [name] is a 62 y.o. male.  62 yo M w/ h/o BPH s/p indwelling catheter removed a few days ago and has been on Rapaflo since then and doing straight caths with improving symptoms. Attempted cath twice today and had quite a bit of hematuria afterwards.         Home Medications Prior to Admission medications   Medication Sig Start Date End Date Taking? Authorizing Provider  Ascorbic Acid (VITAMIN C) 1000 MG tablet Take 1,000 mg by mouth 2 (two) times daily.    [provider]  aspirin EC 81 MG tablet Take 81 mg by mouth daily.    [provider]  B Complex-C (SUPER B COMPLEX PO) Take 1 tablet by mouth daily.    [provider]  clopidogrel (PLAVIX) 75 MG tablet TAKE 1 TABLET BY MOUTH EVERY DAY 11/13/22   Lennette Bihari, MD  Coenzyme Q10 (CO Q 10) 100 MG CAPS Take 1 capsule by mouth daily.    [provider]  ezetimibe (ZETIA) 10 MG tablet TAKE 1 TABLET BY MOUTH EVERY DAY 01/28/23   Lennette Bihari, MD  Ginger, Zingiber officinalis, 550 MG CAPS Take 1 capsule by mouth daily.    [provider]  Magnesium 250 MG TABS Take 1 tablet by mouth daily.    [provider]  Multiple Vitamin (MULITIVITAMIN WITH MINERALS) TABS Take 1 tablet by mouth daily.    [provider]  nebivolol (BYSTOLIC) 5 MG tablet Take 1 tablet (5 mg total) by mouth daily. 05/09/20   Jodelle Gross, NP  Omega-3 Fatty Acids (FISH OIL) 1000 MG CPDR Take 1,000 mg by mouth in the morning and at bedtime.    [provider]  rosuvastatin (CRESTOR) 40 MG tablet TAKE 1 TABLET BY MOUTH EVERY DAY 11/13/22   Lennette Bihari, MD  silodosin (RAPAFLO) 8 MG CAPS capsule Take  1 capsule (8 mg total) by mouth daily with breakfast. 09/02/23   Donnita Falls, FNP  testosterone cypionate (DEPOTESTOSTERONE CYPIONATE) 200 MG/ML injection Inject 1 mL (200 mg total) into the muscle every 14 (fourteen) days. 02/25/23   McKenzie, Mardene Celeste, MD  valsartan (DIOVAN) 160 MG tablet TAKE 1 TABLET BY MOUTH EVERY DAY 05/15/22   Lennette Bihari, MD  vitamin E 400 UNIT capsule Take 400 Units by mouth daily.    [provider]  Zinc 50 MG TABS Take 1 tablet by mouth daily.    [provider]      Allergies    Other, Pantoprazole, and Simvastatin    Review of Systems   Review of Systems  Physical Exam Updated Vital Signs BP (!) 147/71 (BP Location: Left Arm)   Pulse 87   Temp 98.9 F (37.2 C) (Oral)   Resp 18   Ht 5\' 10"  (1.778 m)   Wt 102.1 kg   SpO2 97%   BMI 32.28 kg/m  Physical Exam  ED Results / Procedures / Treatments   Labs (all labs ordered are listed, but only abnormal results are displayed) Labs Reviewed - No data to display  EKG None  Radiology No results found.  Procedures Procedures  {Document cardiac monitor, telemetry assessment procedure when  appropriate:1}  Medications Ordered in ED Medications - No data to display  ED Course/ Medical Decision Making/ A&P   {   Click here for ABCD2, HEART and other calculatorsREFRESH [name] before signing :1}                              Medical Decision Making  ***  {Document critical care time when appropriate:1} {Document review of labs and clinical decision tools ie heart score, Chads2Vasc2 etc:1}  {Document your independent review of radiology images, and any outside records:1} {Document your discussion with family members, caretakers, and with consultants:1} {Document social determinants of health affecting pt's care:1} {Document your decision making why or why not admission, treatments were needed:1} Final Clinical Impression(s) / ED Diagnoses Final diagnoses:  None    Rx /  DC Orders ED Discharge Orders     None

## 2023-09-05 NOTE — ED Triage Notes (Addendum)
Pt with urinary retention. States he has an enlarged prostate and just recently had his indwelling catheter removed and was sent home with single-use catheters to use. Pt states he was unable to insert catheter tonight after two attempts d/t obstruction and bright red blood. Pt states he has "only dribbled a lit bit of urine today".

## 2023-09-05 NOTE — ED Provider Notes (Signed)
[place] EMERGENCY DEPARTMENT AT [facility] Provider Note   CSN: [id number] Arrival date & time: 09/05/23  2312     History  Chief Complaint  Patient presents with   Urinary Retention    Jonathan [name] is a 62 y.o. male.  62 yo M w/ h/o BPH s/p indwelling catheter removed a few days ago and has been on Rapaflo since then and doing straight caths with improving symptoms. Attempted cath twice today and had quite a bit of hematuria afterwards.         Home Medications Prior to Admission medications   Medication Sig Start Date End Date Taking? Authorizing Provider  cephALEXin (KEFLEX) 500 MG capsule Take 1 capsule (500 mg total) by mouth 4 (four) times daily. 09/06/23  Yes Sostenes Kauffmann, Barbara Cower, MD  Ascorbic Acid (VITAMIN C) 1000 MG tablet Take 1,000 mg by mouth 2 (two) times daily.    [provider]  aspirin EC 81 MG tablet Take 81 mg by mouth daily.    [provider]  B Complex-C (SUPER B COMPLEX PO) Take 1 tablet by mouth daily.    [provider]  clopidogrel (PLAVIX) 75 MG tablet TAKE 1 TABLET BY MOUTH EVERY DAY 11/13/22   Lennette Bihari, MD  Coenzyme Q10 (CO Q 10) 100 MG CAPS Take 1 capsule by mouth daily.    [provider]  ezetimibe (ZETIA) 10 MG tablet TAKE 1 TABLET BY MOUTH EVERY DAY 01/28/23   Lennette Bihari, MD  Ginger, Zingiber officinalis, 550 MG CAPS Take 1 capsule by mouth daily.    [provider]  Magnesium 250 MG TABS Take 1 tablet by mouth daily.    [provider]  Multiple Vitamin (MULITIVITAMIN WITH MINERALS) TABS Take 1 tablet by mouth daily.    [provider]  nebivolol (BYSTOLIC) 5 MG tablet Take 1 tablet (5 mg total) by mouth daily. 05/09/20   Jodelle Gross, NP  Omega-3 Fatty Acids (FISH OIL) 1000 MG CPDR Take 1,000 mg by mouth in the morning and at bedtime.    [provider]  rosuvastatin (CRESTOR) 40 MG tablet TAKE 1 TABLET BY MOUTH EVERY DAY 11/13/22   Lennette Bihari, MD  silodosin (RAPAFLO) 8 MG CAPS capsule Take 1 capsule (8 mg total) by mouth daily with breakfast. 09/02/23   Donnita Falls, FNP  testosterone cypionate (DEPOTESTOSTERONE CYPIONATE) 200 MG/ML injection Inject 1 mL (200 mg total) into the muscle every 14 (fourteen) days. 02/25/23   McKenzie, Mardene Celeste, MD  valsartan (DIOVAN) 160 MG tablet TAKE 1 TABLET BY MOUTH EVERY DAY 05/15/22   Lennette Bihari, MD  vitamin E 400 UNIT capsule Take 400 Units by mouth daily.    [provider]  Zinc 50 MG TABS Take 1 tablet by mouth daily.    [provider]      Allergies    Other, Pantoprazole, and Simvastatin    Review of Systems   Review of Systems  Physical Exam Updated Vital Signs BP (!) 143/87   Pulse 80   Temp 98.9 F (37.2 C) (Oral)   Resp 18   Ht 5\' 10"  (1.778 m)   Wt 102.1 kg   SpO2 95%   BMI 32.28 kg/m  Physical Exam  ED Results / Procedures / Treatments   Labs (all labs ordered are listed, but only abnormal results are displayed) Labs Reviewed  URINALYSIS, W/ REFLEX TO CULTURE (INFECTION SUSPECTED) - Abnormal; Notable for the  following components:      Result Value   Color, Urine AMBER (*)    APPearance CLOUDY (*)    Hgb urine dipstick LARGE (*)    Ketones, ur 5 (*)    Protein, ur 100 (*)    Leukocytes,Ua MODERATE (*)    All other components within normal limits    EKG None  Radiology No results found.  Procedures Procedures    Medications Ordered in ED Medications  cephALEXin (KEFLEX) capsule 1,000 mg (1,000 mg Oral Given 09/06/23 0206)    ED Course/ Medical Decision Making/ A&P                                 Medical Decision Making Risk Prescription drug management.  Bladder scan showed just over 200cc and he still has the feeling of needing to urinate so not retaining like he was the other day.  Patient urinating normally with minimal hematuria. On reevaluation patient states he has continued urge to urinate and  subsequently is only urinating 15-20 mL at a time. Doesn't seem c/w retention, wonder if this is from bladder irritation/inflammation/infection? Pending UA.  Urine with possible infection, culture sent. Will start antibiotics as that could be causing his symptoms. On discussing this with him, he states that all of this started with dysuria and continues to have it now so will treat. Fu w/ urology if continued symptoms, here if worsening.  Final Clinical Impression(s) / ED Diagnoses Final diagnoses:  Urinary frequency  Hematuria, unspecified type    Rx / DC Orders ED Discharge Orders          Ordered    cephALEXin (KEFLEX) 500 MG capsule  4 times daily        09/06/23 0137              Duwan Adrian, Barbara Cower, MD 09/06/23 [phone]

## 2023-09-06 LAB — URINALYSIS, W/ REFLEX TO CULTURE (INFECTION SUSPECTED)
Bacteria, UA: NONE SEEN
Bilirubin Urine: NEGATIVE
Glucose, UA: NEGATIVE mg/dL
Ketones, ur: 5 mg/dL — AB
Nitrite: NEGATIVE
Protein, ur: 100 mg/dL — AB
RBC / HPF: >50 RBC/hpf (ref 0–5)
Specific Gravity, Urine: 1.013 (ref 1.005–1.030)
pH: 6 (ref 5.0–8.0)

## 2023-09-06 MED ORDER — CEPHALEXIN 500 MG PO CAPS
1000.0000 mg | ORAL_CAPSULE | Freq: Once | ORAL | Status: AC
  Administered 2023-09-06: 1000 mg via ORAL
  Filled 2023-09-06: qty 2

## 2023-09-06 MED ORDER — CEPHALEXIN 500 MG PO CAPS: 500.0000 mg | ORAL_CAPSULE | Freq: Four times a day (QID) | ORAL | 0 refills | Status: DC

## 2023-09-06 NOTE — ED Notes (Signed)
Pt is trying to use urinal.

## 2023-10-15 ENCOUNTER — Ambulatory Visit (INDEPENDENT_AMBULATORY_CARE_PROVIDER_SITE_OTHER): Payer: BC Managed Care – PPO | Admitting: Urology

## 2023-10-15 VITALS — BP 161/67 | HR 73

## 2023-10-15 DIAGNOSIS — R339 Retention of urine, unspecified: Secondary | ICD-10-CM

## 2023-10-15 DIAGNOSIS — R972 Elevated prostate specific antigen [PSA]: Secondary | ICD-10-CM

## 2023-10-15 DIAGNOSIS — N3000 Acute cystitis without hematuria: Secondary | ICD-10-CM

## 2023-10-15 DIAGNOSIS — N138 Other obstructive and reflux uropathy: Secondary | ICD-10-CM

## 2023-10-15 DIAGNOSIS — N401 Enlarged prostate with lower urinary tract symptoms: Secondary | ICD-10-CM | POA: Diagnosis not present

## 2023-10-15 DIAGNOSIS — E291 Testicular hypofunction: Secondary | ICD-10-CM

## 2023-10-15 DIAGNOSIS — Z87440 Personal history of urinary (tract) infections: Secondary | ICD-10-CM

## 2023-10-15 LAB — MICROSCOPIC EXAMINATION: WBC, UA: >30 /[HPF] — AB (ref 0–5)

## 2023-10-15 LAB — URINALYSIS, ROUTINE W REFLEX MICROSCOPIC
Bilirubin, UA: NEGATIVE
Glucose, UA: NEGATIVE
Ketones, UA: NEGATIVE
Nitrite, UA: NEGATIVE
Specific Gravity, UA: 1.025 (ref 1.005–1.030)
Urobilinogen, Ur: 0.2 mg/dL (ref 0.2–1.0)
pH, UA: 6 (ref 5.0–7.5)

## 2023-10-15 MED ORDER — TESTOSTERONE CYPIONATE 200 MG/ML IM SOLN: 200.0000 mg | INTRAMUSCULAR | 2 refills | Status: DC

## 2023-10-15 MED ORDER — ALFUZOSIN HCL ER 10 MG PO TB24: 10.0000 mg | ORAL_TABLET | Freq: Every day | ORAL | 3 refills | Status: DC

## 2023-10-15 NOTE — Progress Notes (Signed)
10/15/2023 11:49 AM   Jonathan [name] [birth year] [id number]  Referring provider: No referring provider defined for this encounter.  Followup BPH   HPI: Jonathan [name] is a 62yo here for followup for BPH and urinary retention. He is back on uroxatral due to side effects from rapaflo. He no longer has to perform CIC. He had a UTI one month ago. Last PSA was 5.7. Prostate MRI showed a small PIRADs 3 lesion and prostate volume 129cc.    PMH: Past Medical History:  Diagnosis Date   Abnormal stress test 2011   SEHV-EF 59%, Moderate perfusion defect due to scar, mild periinfarct   Anxiety    Coronary artery disease    Echocardiogram 2011   mild posterior wall hypokinesis   GERD (gastroesophageal reflux disease)    Heart disease    Heart murmur    History of stress test 07/16/2012   abnormal myocardial perfusion study. No significant change from 08/03/2010 study, low risk scan.   Hx of echocardiogram 12/14/2010   Hyperlipidemia    Hypertension    Myocardial infarction [facility]) nov 2010   [name]   Peripheral vascular disease (HCC)    Simvastatin-induced rhabdomyolysis    Substance abuse (HCC)    > 20 years ago, marijauna, cocaine, hash    Surgical History: Past Surgical History:  Procedure Laterality Date   CARDIAC CATHETERIZATION  12/14/2010   By Dr [name] showed a widely patent circumflex stent with mild plaque in his LAD.   CORONARY STENT PLACEMENT  10/06/2009   Successful percutaneous coronary intervention of the circumflex marginal-2 vessel with percutaneous transluminal coronary angioplasty, stenting with a 2.75 x 18 mm non-drug eluting stent Vision Stent post-dilated to 3.0 mm.    Home Medications:  Allergies as of 10/15/2023       Reactions   Other    Apples - gi upset   Pantoprazole Other (See Comments)   More stomach problems   Simvastatin Other (See Comments)   myalgia        Medication List        Accurate as of October 15, 2023 11:49 AM.  If you have any questions, ask your nurse or doctor.          STOP taking these medications    cephALEXin 500 MG capsule Commonly known as: KEFLEX   silodosin 8 MG Caps capsule Commonly known as: RAPAFLO       TAKE these medications    alfuzosin 10 MG 24 hr tablet Commonly known as: UROXATRAL Take 1 tablet by mouth daily with breakfast.   aspirin EC 81 MG tablet Take 81 mg by mouth daily.   clopidogrel 75 MG tablet Commonly known as: PLAVIX TAKE 1 TABLET BY MOUTH EVERY DAY   Co Q 10 100 MG Caps Take 1 capsule by mouth daily.   ezetimibe 10 MG tablet Commonly known as: ZETIA TAKE 1 TABLET BY MOUTH EVERY DAY   Fish Oil 1000 MG Cpdr Take 1,000 mg by mouth in the morning and at bedtime.   Ginger (Zingiber officinalis) 550 MG Caps Take 1 capsule by mouth daily.   Magnesium 250 MG Tabs Take 1 tablet by mouth daily.   multivitamin with minerals Tabs tablet Take 1 tablet by mouth daily.   nebivolol 5 MG tablet Commonly known as: BYSTOLIC Take 1 tablet (5 mg total) by mouth daily.   rosuvastatin 40 MG tablet Commonly known as: CRESTOR TAKE 1 TABLET BY MOUTH EVERY DAY   SUPER B COMPLEX  PO Take 1 tablet by mouth daily.   testosterone cypionate 200 MG/ML injection Commonly known as: DEPOTESTOSTERONE CYPIONATE Inject 1 mL (200 mg total) into the muscle every 14 (fourteen) days.   valsartan 160 MG tablet Commonly known as: DIOVAN TAKE 1 TABLET BY MOUTH EVERY DAY   vitamin C 1000 MG tablet Take 1,000 mg by mouth 2 (two) times daily.   vitamin E 180 MG (400 UNITS) capsule Take 400 Units by mouth daily.   Zinc 50 MG Tabs Take 1 tablet by mouth daily.        Allergies:  Allergies  Allergen Reactions   Other     Apples - gi upset   Pantoprazole Other (See Comments)    More stomach problems   Simvastatin Other (See Comments)    myalgia    Family History: Family History  Problem Relation Age of Onset   Hypertension Mother    Hyperlipidemia  Mother    Arthritis Mother    Hypertension Father    Hyperlipidemia Father    Cancer Father        kidney- METASTATIC   Hypertension Brother    Hyperlipidemia Brother    Hypertension Sister    Hyperlipidemia Sister    Cancer - Lung Maternal Grandmother    Cancer Maternal Grandmother        breast    Social History:  reports that he quit smoking about 16 years ago. His smoking use included cigarettes. He started smoking about 54 years ago. He has a 76 pack-year smoking history. He quit smokeless tobacco use about 16 years ago. He reports current alcohol use of about 7.0 - 10.0 standard drinks of alcohol per week. He reports that he does not use drugs.  ROS: All other review of systems were reviewed and are negative except what is noted above in HPI  Physical Exam: BP (!) 161/67   Pulse 73   Constitutional:  Alert and oriented, No acute distress. HEENT:  AT, moist mucus membranes.  Trachea midline, no masses. Cardiovascular: No clubbing, cyanosis, or edema. Respiratory: Normal respiratory effort, no increased work of breathing. GI: Abdomen is soft, nontender, nondistended, no abdominal masses GU: No CVA tenderness.  Lymph: No cervical or inguinal lymphadenopathy. Skin: No rashes, bruises or suspicious lesions. Neurologic: Grossly intact, no focal deficits, moving all 4 extremities. Psychiatric: Normal mood and affect.  Laboratory Data: Lab Results  Component Value Date   WBC 15.3 (H) 08/17/2023   HGB 15.8 08/17/2023   HCT 47.5 08/17/2023   MCV 90.1 08/17/2023   PLT 274 08/17/2023    Lab Results  Component Value Date   CREATININE 1.08 08/17/2023    Lab Results  Component Value Date   PSA 3.52 08/09/2020   PSA 3.0 05/20/2018   PSA 3.0 02/12/2017    Lab Results  Component Value Date   TESTOSTERONE 139 (L) 10/08/2022    Lab Results  Component Value Date   HGBA1C 5.4 08/09/2020    Urinalysis    Component Value Date/Time   COLORURINE AMBER (A) 09/06/2023  0010   APPEARANCEUR CLOUDY (A) 09/06/2023 0010   APPEARANCEUR Clear 01/08/2023 0905   LABSPEC 1.013 09/06/2023 0010   PHURINE 6.0 09/06/2023 0010   GLUCOSEU NEGATIVE 09/06/2023 0010   HGBUR LARGE (A) 09/06/2023 0010   BILIRUBINUR NEGATIVE 09/06/2023 0010   BILIRUBINUR Negative 01/08/2023 0905   KETONESUR 5 (A) 09/06/2023 0010   PROTEINUR 100 (A) 09/06/2023 0010   UROBILINOGEN 0.2 07/28/2012 1357   NITRITE NEGATIVE 09/06/2023  0010   LEUKOCYTESUR MODERATE (A) 09/06/2023 0010    Lab Results  Component Value Date   LABMICR See below: 01/08/2023   WBCUA 0-5 01/08/2023   LABEPIT 0-10 01/08/2023   BACTERIA NONE SEEN 09/06/2023    Pertinent Imaging:  No results found for this or any previous visit.  No results found for this or any previous visit.  No results found for this or any previous visit.  No results found for this or any previous visit.  No results found for this or any previous visit.  No valid procedures specified. No results found for this or any previous visit.  No results found for this or any previous visit.   Assessment & Plan:    1. Benign prostatic hyperplasia with urinary obstruction Continue uroxatral 10mg  daily - Urinalysis, Routine w reflex microscopic  2. Urinary retention -continue uroxatral 10mg  daily  3. Elevated PSA Followup 6 months with PSA  4. Hypogonadism, -restart IM testosterone Followup 3 months with testosterone labs   No follow-ups on file.  Wilkie Aye, MD  [facility] [place]

## 2023-10-18 ENCOUNTER — Encounter: Payer: Self-pay | Admitting: Urology

## 2023-10-18 NOTE — Patient Instructions (Signed)

## 2023-10-20 LAB — URINE CULTURE

## 2023-10-21 ENCOUNTER — Telehealth: Payer: Self-pay

## 2023-10-21 MED ORDER — SULFAMETHOXAZOLE-TRIMETHOPRIM 800-160 MG PO TABS: 1.0000 | ORAL_TABLET | Freq: Two times a day (BID) | ORAL | 0 refills | Status: AC

## 2023-10-21 NOTE — Telephone Encounter (Signed)
-----   Message from Wilkie Aye sent at 10/21/2023  8:19 AM EST ----- Bactrdim DS BID for 7 days ----- Message ----- From: Gustavus Messing, LPN Sent: 52/06/4131   1:53 PM EST To: Malen Gauze, MD  No treatment started

## 2023-10-21 NOTE — Telephone Encounter (Signed)
Rx sent. Patient called and made aware. 

## 2024-04-13 ENCOUNTER — Other Ambulatory Visit: Payer: BC Managed Care – PPO

## 2024-04-13 DIAGNOSIS — R972 Elevated prostate specific antigen [PSA]: Secondary | ICD-10-CM

## 2024-04-14 LAB — PSA, TOTAL AND FREE
PSA, Free Pct: 26.3 %
PSA, Free: 1.68 ng/mL
Prostate Specific Ag, Serum: 6.4 ng/mL — ABNORMAL HIGH (ref 0.0–4.0)

## 2024-04-23 ENCOUNTER — Ambulatory Visit (INDEPENDENT_AMBULATORY_CARE_PROVIDER_SITE_OTHER): Payer: BC Managed Care – PPO | Admitting: Urology

## 2024-04-23 ENCOUNTER — Encounter: Payer: Self-pay | Admitting: Urology

## 2024-04-23 VITALS — BP 124/70 | HR 64

## 2024-04-23 DIAGNOSIS — E291 Testicular hypofunction: Secondary | ICD-10-CM

## 2024-04-23 DIAGNOSIS — R338 Other retention of urine: Secondary | ICD-10-CM

## 2024-04-23 DIAGNOSIS — R972 Elevated prostate specific antigen [PSA]: Secondary | ICD-10-CM

## 2024-04-23 DIAGNOSIS — N401 Enlarged prostate with lower urinary tract symptoms: Secondary | ICD-10-CM

## 2024-04-23 DIAGNOSIS — N138 Other obstructive and reflux uropathy: Secondary | ICD-10-CM

## 2024-04-23 DIAGNOSIS — R339 Retention of urine, unspecified: Secondary | ICD-10-CM

## 2024-04-23 LAB — MICROSCOPIC EXAMINATION
Bacteria, UA: NONE SEEN
RBC, Urine: NONE SEEN /HPF (ref 0–2)

## 2024-04-23 LAB — URINALYSIS, ROUTINE W REFLEX MICROSCOPIC
Bilirubin, UA: NEGATIVE
Glucose, UA: NEGATIVE
Ketones, UA: NEGATIVE
Nitrite, UA: NEGATIVE
Protein,UA: NEGATIVE
RBC, UA: NEGATIVE
Specific Gravity, UA: 1.015 (ref 1.005–1.030)
Urobilinogen, Ur: 0.2 mg/dL (ref 0.2–1.0)
pH, UA: 6 (ref 5.0–7.5)

## 2024-04-23 MED ORDER — TESTOSTERONE CYPIONATE 200 MG/ML IM SOLN: 200.0000 mg | INTRAMUSCULAR | 2 refills | Status: DC

## 2024-04-23 MED ORDER — ALFUZOSIN HCL ER 10 MG PO TB24: 10.0000 mg | ORAL_TABLET | Freq: Every day | ORAL | 3 refills | Status: DC

## 2024-04-23 NOTE — Progress Notes (Signed)
 04/23/2024 9:24 AM   Jonathan [name] 02/13/1961 [id number]  Referring provider: Darnelle Elders, NP [address] [place],  [place] [address]  Followup elevated PSA   HPI: Mr Sloane is a 63yo here for followup for elevated PSA, hypogonadism, and BPH. PSA increased to 6.4. No recent testosterone  labs. Energy is good. Libido is good. He is due for an injection on Monday. IPSS 9 QOl 2 on uroxatral  10mg . Uirne stream strong. No straining to urinate. Nocturia 1x.    PMH: Past Medical History:  Diagnosis Date   Abnormal stress test 2011   SEHV-EF 59%, Moderate perfusion defect due to scar, mild periinfarct   Anxiety    Coronary artery disease    Echocardiogram 2011   mild posterior wall hypokinesis   GERD (gastroesophageal reflux disease)    Heart disease    Heart murmur    History of stress test 07/16/2012   abnormal myocardial perfusion study. No significant change from 08/03/2010 study, low risk scan.   Hx of echocardiogram 12/14/2010   Hyperlipidemia    Hypertension    Myocardial infarction [facility]) nov 2010   Terrace Ferrara   Peripheral vascular disease (HCC)    Simvastatin-induced rhabdomyolysis    Substance abuse (HCC)    > 20 years ago, marijauna, cocaine, hash    Surgical History: Past Surgical History:  Procedure Laterality Date   CARDIAC CATHETERIZATION  12/14/2010   By Dr Alvis Ba showed a widely patent circumflex stent with mild plaque in his LAD.   CORONARY STENT PLACEMENT  10/06/2009   Successful percutaneous coronary intervention of the circumflex marginal-2 vessel with percutaneous transluminal coronary angioplasty, stenting with a 2.75 x 18 mm non-drug eluting stent Vision Stent post-dilated to 3.0 mm.    Home Medications:  Allergies as of 04/23/2024       Reactions   Other    Apples - gi upset   Pantoprazole Other (See Comments)   More stomach problems   Simvastatin Other (See Comments)   myalgia        Medication List         Accurate as of April 23, 2024  9:24 AM. If you have any questions, ask your nurse or doctor.          alfuzosin  10 MG 24 hr tablet Commonly known as: UROXATRAL  Take 1 tablet (10 mg total) by mouth at bedtime.   aspirin EC 81 MG tablet Take 81 mg by mouth daily.   clopidogrel  75 MG tablet Commonly known as: PLAVIX  TAKE 1 TABLET BY MOUTH EVERY DAY   Co Q 10 100 MG Caps Take 1 capsule by mouth daily.   ezetimibe  10 MG tablet Commonly known as: ZETIA  TAKE 1 TABLET BY MOUTH EVERY DAY   Fish Oil 1000 MG Cpdr Take 1,000 mg by mouth in the morning and at bedtime.   Ginger (Zingiber officinalis) 550 MG Caps Take 1 capsule by mouth daily.   Magnesium 250 MG Tabs Take 1 tablet by mouth daily.   multivitamin with minerals Tabs tablet Take 1 tablet by mouth daily.   nebivolol  5 MG tablet Commonly known as: BYSTOLIC  Take 1 tablet (5 mg total) by mouth daily.   rosuvastatin  40 MG tablet Commonly known as: CRESTOR  TAKE 1 TABLET BY MOUTH EVERY DAY   sulfamethoxazole -trimethoprim  800-160 MG tablet Commonly known as: BACTRIM  DS Take 1 tablet by mouth 2 (two) times daily.   SUPER B COMPLEX PO Take 1 tablet by mouth daily.   testosterone   cypionate 200 MG/ML injection Commonly known as: DEPOTESTOSTERONE CYPIONATE Inject 1 mL (200 mg total) into the muscle every 14 (fourteen) days.   valsartan  160 MG tablet Commonly known as: DIOVAN  TAKE 1 TABLET BY MOUTH EVERY DAY   vitamin C 1000 MG tablet Take 1,000 mg by mouth 2 (two) times daily.   vitamin E 180 MG (400 UNITS) capsule Take 400 Units by mouth daily.   Zinc 50 MG Tabs Take 1 tablet by mouth daily.        Allergies:  Allergies  Allergen Reactions   Other     Apples - gi upset   Pantoprazole Other (See Comments)    More stomach problems   Simvastatin Other (See Comments)    myalgia    Family History: Family History  Problem Relation Age of Onset   Hypertension Mother    Hyperlipidemia Mother     Arthritis Mother    Hypertension Father    Hyperlipidemia Father    Cancer Father        kidney- METASTATIC   Hypertension Brother    Hyperlipidemia Brother    Hypertension Sister    Hyperlipidemia Sister    Cancer - Lung Maternal Grandmother    Cancer Maternal Grandmother        breast    Social History:  reports that he quit smoking about 17 years ago. His smoking use included cigarettes. He started smoking about 55 years ago. He has a 76 pack-year smoking history. He quit smokeless tobacco use about 17 years ago. He reports current alcohol use of about 7.0 - 10.0 standard drinks of alcohol per week. He reports that he does not use drugs.  ROS: All other review of systems were reviewed and are negative except what is noted above in HPI  Physical Exam: BP 124/70   Pulse 64   Constitutional:  Alert and oriented, No acute distress. HEENT: [place] AT, moist mucus membranes.  Trachea midline, no masses. Cardiovascular: No clubbing, cyanosis, or edema. Respiratory: Normal respiratory effort, no increased work of breathing. GI: Abdomen is soft, nontender, nondistended, no abdominal masses GU: No CVA tenderness.  Lymph: No cervical or inguinal lymphadenopathy. Skin: No rashes, bruises or suspicious lesions. Neurologic: Grossly intact, no focal deficits, moving all 4 extremities. Psychiatric: Normal mood and affect.  Laboratory Data: Lab Results  Component Value Date   WBC 15.3 (H) 08/17/2023   HGB 15.8 08/17/2023   HCT 47.5 08/17/2023   MCV 90.1 08/17/2023   PLT 274 08/17/2023    Lab Results  Component Value Date   CREATININE 1.08 08/17/2023    Lab Results  Component Value Date   PSA 3.52 08/09/2020   PSA 3.0 05/20/2018   PSA 3.0 02/12/2017    Lab Results  Component Value Date   TESTOSTERONE  139 (L) 10/08/2022    Lab Results  Component Value Date   HGBA1C 5.4 08/09/2020    Urinalysis    Component Value Date/Time   COLORURINE AMBER (A) 09/06/2023 0010    APPEARANCEUR Cloudy (A) 10/15/2023 1138   LABSPEC 1.013 09/06/2023 0010   PHURINE 6.0 09/06/2023 0010   GLUCOSEU Negative 10/15/2023 1138   HGBUR LARGE (A) 09/06/2023 0010   BILIRUBINUR Negative 10/15/2023 1138   KETONESUR 5 (A) 09/06/2023 0010   PROTEINUR 1+ (A) 10/15/2023 1138   PROTEINUR 100 (A) 09/06/2023 0010   UROBILINOGEN 0.2 07/28/2012 1357   NITRITE Negative 10/15/2023 1138   NITRITE NEGATIVE 09/06/2023 0010   LEUKOCYTESUR 3+ (A) 10/15/2023 1138  LEUKOCYTESUR MODERATE (A) 09/06/2023 0010    Lab Results  Component Value Date   LABMICR See below: 10/15/2023   WBCUA >30 (A) 10/15/2023   LABEPIT 0-10 10/15/2023   BACTERIA Moderate (A) 10/15/2023    Pertinent Imaging:  No results found for this or any previous visit.  No results found for this or any previous visit.  No results found for this or any previous visit.  No results found for this or any previous visit.  No results found for this or any previous visit.  No results found for this or any previous visit.  No results found for this or any previous visit.  No results found for this or any previous visit.   Assessment & Plan:    1. Elevated PSA (Primary) -Iso PSA, will call with results. If normal he will followup in 6 months with PSA - Urinalysis, Routine w reflex microscopic  2. Benign prostatic hyperplasia with urinary obstruction -continue uroxatral  10mg  qhs  3. Urinary retention Continue uroxatral  10mg  at bedtime  4. Hypogonadism -continue IM testosterone  200mg  every 2 weeks -labs today and followup 6 months labs   No follow-ups on file.  Johnie Nailer, MD  [facility] [place]

## 2024-04-23 NOTE — Patient Instructions (Signed)

## 2024-04-26 ENCOUNTER — Telehealth: Payer: Self-pay

## 2024-04-26 NOTE — Telephone Encounter (Signed)
 Detailed message left regarding ISO PSA results and Dr. Mozell Arias recommendation to f/u in 6 months with a PSA.Aaron Aas

## 2024-10-22 ENCOUNTER — Other Ambulatory Visit: Payer: Self-pay | Admitting: Urology

## 2024-10-25 ENCOUNTER — Other Ambulatory Visit

## 2024-10-25 DIAGNOSIS — E291 Testicular hypofunction: Secondary | ICD-10-CM

## 2024-10-27 LAB — COMPREHENSIVE METABOLIC PANEL WITH GFR
ALT: 36 IU/L (ref 0–44)
AST: 31 IU/L (ref 0–40)
Albumin: 4.6 g/dL (ref 3.9–4.9)
Alkaline Phosphatase: 57 IU/L (ref 47–123)
BUN/Creatinine Ratio: 11 (ref 10–24)
BUN: 12 mg/dL (ref 8–27)
Bilirubin Total: 1.6 mg/dL — ABNORMAL HIGH (ref 0.0–1.2)
CO2: 27 mmol/L (ref 20–29)
Calcium: 9.6 mg/dL (ref 8.6–10.2)
Chloride: 102 mmol/L (ref 96–106)
Creatinine, Ser: 1.1 mg/dL (ref 0.76–1.27)
Globulin, Total: 1.9 g/dL (ref 1.5–4.5)
Glucose: 102 mg/dL — ABNORMAL HIGH (ref 70–99)
Potassium: 4.7 mmol/L (ref 3.5–5.2)
Sodium: 142 mmol/L (ref 134–144)
Total Protein: 6.5 g/dL (ref 6.0–8.5)
eGFR: 74 mL/min/1.73 (ref >59)

## 2024-10-27 LAB — CBC
Hematocrit: 57.1 % — ABNORMAL HIGH (ref 37.5–51.0)
Hemoglobin: 18.7 g/dL — ABNORMAL HIGH (ref 13.0–17.7)
MCH: 30.9 pg (ref 26.6–33.0)
MCHC: 32.7 g/dL (ref 31.5–35.7)
MCV: 94 fL (ref 79–97)
Platelets: 255 x10E3/uL (ref 150–450)
RBC: 6.05 x10E6/uL — ABNORMAL HIGH (ref 4.14–5.80)
RDW: 12.4 % (ref 11.6–15.4)
WBC: 8.2 x10E3/uL (ref 3.4–10.8)

## 2024-10-27 LAB — TESTOSTERONE,FREE AND TOTAL
Testosterone, Free: 6.9 pg/mL (ref 6.6–18.1)
Testosterone: 467 ng/dL (ref 264–916)

## 2024-10-29 ENCOUNTER — Ambulatory Visit: Admitting: Urology

## 2024-10-29 VITALS — BP 162/71 | HR 74

## 2024-10-29 DIAGNOSIS — N401 Enlarged prostate with lower urinary tract symptoms: Secondary | ICD-10-CM | POA: Diagnosis not present

## 2024-10-29 DIAGNOSIS — R972 Elevated prostate specific antigen [PSA]: Secondary | ICD-10-CM | POA: Diagnosis not present

## 2024-10-29 DIAGNOSIS — N138 Other obstructive and reflux uropathy: Secondary | ICD-10-CM

## 2024-10-29 DIAGNOSIS — R338 Other retention of urine: Secondary | ICD-10-CM | POA: Diagnosis not present

## 2024-10-29 DIAGNOSIS — R339 Retention of urine, unspecified: Secondary | ICD-10-CM

## 2024-10-29 DIAGNOSIS — E291 Testicular hypofunction: Secondary | ICD-10-CM | POA: Diagnosis not present

## 2024-10-29 LAB — URINALYSIS, ROUTINE W REFLEX MICROSCOPIC
Bilirubin, UA: NEGATIVE
Glucose, UA: NEGATIVE
Leukocytes,UA: NEGATIVE
Nitrite, UA: NEGATIVE
Protein,UA: NEGATIVE
RBC, UA: NEGATIVE
Specific Gravity, UA: 1.02 (ref 1.005–1.030)
Urobilinogen, Ur: 0.2 mg/dL (ref 0.2–1.0)
pH, UA: 6 (ref 5.0–7.5)

## 2024-10-29 MED ORDER — TESTOSTERONE CYPIONATE 200 MG/ML IM SOLN: 200.0000 mg | INTRAMUSCULAR | 3 refills | Status: AC

## 2024-10-29 MED ORDER — ALFUZOSIN HCL ER 10 MG PO TB24: 10.0000 mg | ORAL_TABLET | Freq: Every day | ORAL | 3 refills | Status: AC

## 2024-10-29 NOTE — Progress Notes (Unsigned)
 10/29/2024 9:46 AM   Jonathan [name] 08/25/1961 [id number]  Referring provider: Kristine Corean Deed, NP [address] [place],  [place] [address]  No chief complaint on file.   HPI: Jonathan [name] is a 63yo here for followup for hypogonadism and BPh with incomplete emptying/urinary retention. Testosteron 467. Hemoglobin 18.7. CMP normal. He injects 200mg  of IM testosterone  every 2 weeks. Energy is good. IPPS 4 QOL 1 on uroxatral  10mg . No feeling of incomplete emptying   PMH: Past Medical History:  Diagnosis Date   Abnormal stress test 2011   SEHV-EF 59%, Moderate perfusion defect due to scar, mild periinfarct   Anxiety    Coronary artery disease    Echocardiogram 2011   mild posterior wall hypokinesis   GERD (gastroesophageal reflux disease)    Heart disease    Heart murmur    History of stress test 07/16/2012   abnormal myocardial perfusion study. No significant change from 08/03/2010 study, low risk scan.   Hx of echocardiogram 12/14/2010   Hyperlipidemia    Hypertension    Myocardial infarction [facility]) nov 2010   Jonathan [name]   Peripheral vascular disease    Simvastatin-induced rhabdomyolysis    Substance abuse (HCC)    > 20 years ago, marijauna, cocaine, hash    Surgical History: Past Surgical History:  Procedure Laterality Date   CARDIAC CATHETERIZATION  12/14/2010   By Dr Jonathan [name] showed a widely patent circumflex stent with mild plaque in his LAD.   CORONARY STENT PLACEMENT  10/06/2009   Successful percutaneous coronary intervention of the circumflex marginal-2 vessel with percutaneous transluminal coronary angioplasty, stenting with a 2.75 x 18 mm non-drug eluting stent Vision Stent post-dilated to 3.0 mm.    Home Medications:  Allergies as of 10/29/2024       Reactions   Other    Apples - gi upset   Pantoprazole Other (See Comments)   More stomach problems   Simvastatin Other (See Comments)   myalgia        Medication List         Accurate as of October 29, 2024  9:46 AM. If you have any questions, ask your nurse or doctor.          alfuzosin  10 MG 24 hr tablet Commonly known as: UROXATRAL  Take 1 tablet (10 mg total) by mouth at bedtime.   aspirin EC 81 MG tablet Take 81 mg by mouth daily.   clopidogrel  75 MG tablet Commonly known as: PLAVIX  TAKE 1 TABLET BY MOUTH EVERY DAY   Co Q 10 100 MG Caps Take 1 capsule by mouth daily.   ezetimibe  10 MG tablet Commonly known as: ZETIA  TAKE 1 TABLET BY MOUTH EVERY DAY   Fish Oil 1000 MG Cpdr Take 1,000 mg by mouth in the morning and at bedtime.   Ginger (Zingiber officinalis) 550 MG Caps Take 1 capsule by mouth daily.   Magnesium 250 MG Tabs Take 1 tablet by mouth daily.   multivitamin with minerals Tabs tablet Take 1 tablet by mouth daily.   nebivolol  5 MG tablet Commonly known as: BYSTOLIC  Take 1 tablet (5 mg total) by mouth daily.   rosuvastatin  40 MG tablet Commonly known as: CRESTOR  TAKE 1 TABLET BY MOUTH EVERY DAY   sulfamethoxazole -trimethoprim  800-160 MG tablet Commonly known as: BACTRIM  DS Take 1 tablet by mouth 2 (two) times daily.   SUPER B COMPLEX PO Take 1 tablet by mouth daily.   testosterone  cypionate 200 MG/ML injection Commonly known  as: DEPOTESTOSTERONE CYPIONATE INJECT 1 ML (200 MG TOTAL) INTO THE MUSCLE EVERY 14 DAYS   valsartan  160 MG tablet Commonly known as: DIOVAN  TAKE 1 TABLET BY MOUTH EVERY DAY   vitamin C 1000 MG tablet Take 1,000 mg by mouth 2 (two) times daily.   vitamin E 180 MG (400 UNITS) capsule Take 400 Units by mouth daily.   Zinc 50 MG Tabs Take 1 tablet by mouth daily.        Allergies: Allergies[1]  Family History: Family History  Problem Relation Age of Onset   Hypertension Mother    Hyperlipidemia Mother    Arthritis Mother    Hypertension Father    Hyperlipidemia Father    Cancer Father        kidney- METASTATIC   Hypertension Brother    Hyperlipidemia Brother     Hypertension Sister    Hyperlipidemia Sister    Cancer - Lung Maternal Grandmother    Cancer Maternal Grandmother        breast    Social History:  reports that he quit smoking about 17 years ago. His smoking use included cigarettes. He started smoking about 55 years ago. He has a 76 pack-year smoking history. He quit smokeless tobacco use about 17 years ago. He reports current alcohol use of about 7.0 - 10.0 standard drinks of alcohol per week. He reports that he does not use drugs.  ROS: All other review of systems were reviewed and are negative except what is noted above in HPI  Physical Exam: BP (!) 162/71   Pulse 74   Constitutional:  Alert and oriented, No acute distress. HEENT: [place] AT, moist mucus membranes.  Trachea midline, no masses. Cardiovascular: No clubbing, cyanosis, or edema. Respiratory: Normal respiratory effort, no increased work of breathing. GI: Abdomen is soft, nontender, nondistended, no abdominal masses GU: No CVA tenderness.  Lymph: No cervical or inguinal lymphadenopathy. Skin: No rashes, bruises or suspicious lesions. Neurologic: Grossly intact, no focal deficits, moving all 4 extremities. Psychiatric: Normal mood and affect.  Laboratory Data: Lab Results  Component Value Date   WBC 8.2 10/25/2024   HGB 18.7 (H) 10/25/2024   HCT 57.1 (H) 10/25/2024   MCV 94 10/25/2024   PLT 255 10/25/2024    Lab Results  Component Value Date   CREATININE 1.10 10/25/2024    Lab Results  Component Value Date   PSA 3.52 08/09/2020   PSA 3.0 05/20/2018   PSA 3.0 02/12/2017    Lab Results  Component Value Date   TESTOSTERONE  467 10/25/2024    Lab Results  Component Value Date   HGBA1C 5.4 08/09/2020    Urinalysis    Component Value Date/Time   COLORURINE AMBER (A) 09/06/2023 0010   APPEARANCEUR Clear 04/23/2024 0905   LABSPEC 1.013 09/06/2023 0010   PHURINE 6.0 09/06/2023 0010   GLUCOSEU Negative 04/23/2024 0905   HGBUR LARGE (A) 09/06/2023 0010    BILIRUBINUR Negative 04/23/2024 0905   KETONESUR 5 (A) 09/06/2023 0010   PROTEINUR Negative 04/23/2024 0905   PROTEINUR 100 (A) 09/06/2023 0010   UROBILINOGEN 0.2 07/28/2012 1357   NITRITE Negative 04/23/2024 0905   NITRITE NEGATIVE 09/06/2023 0010   LEUKOCYTESUR Trace (A) 04/23/2024 0905   LEUKOCYTESUR MODERATE (A) 09/06/2023 0010    Lab Results  Component Value Date   LABMICR See below: 04/23/2024   WBCUA 0-5 04/23/2024   LABEPIT 0-10 04/23/2024   BACTERIA None seen 04/23/2024    Pertinent Imaging: *** No results found for this  or any previous visit.  No results found for this or any previous visit.  No results found for this or any previous visit.  No results found for this or any previous visit.  No results found for this or any previous visit.  No results found for this or any previous visit.  No results found for this or any previous visit.  No results found for this or any previous visit.   Assessment & Plan:    1. Elevated PSA (Primary) *** - Urinalysis, Routine w reflex microscopic  2. Hypogonadism in male ***  3. Benign prostatic hyperplasia with urinary obstruction *** - Urinalysis, Routine w reflex microscopic  4. Urinary retention ***   No follow-ups on file.  Belvie Clara, MD  [facility] [place]      [1]  Allergies Allergen Reactions   Other     Apples - gi upset   Pantoprazole Other (See Comments)    More stomach problems   Simvastatin Other (See Comments)    myalgia

## 2024-11-09 ENCOUNTER — Encounter: Payer: Self-pay | Admitting: Urology

## 2024-11-09 NOTE — Patient Instructions (Signed)
 Hypogonadism, Male  Male hypogonadism is a condition of having a level of testosterone  that is lower than normal. Testosterone  is a chemical, or hormone, that is made mainly in the testicles. In boys, testosterone  is responsible for the development of male characteristics during puberty. These include: Making the penis bigger. Growing and building the muscles. Growing facial hair. Deepening the voice. In adult men, testosterone  is responsible for maintaining: An interest in sex and the ability to have sex. Muscle mass. Sperm production. Red blood cell production. Bone strength. Testosterone  also gives men energy and a sense of well-being. Testosterone  normally decreases as men age and the testicles make less testosterone . Testosterone  levels can vary from man to man. Not all men will have signs and symptoms of low testosterone . Weight, alcohol use, medicines, and certain medical conditions can affect a man's testosterone  level. What are the causes? This condition is caused by: A natural decrease in testosterone  that occurs as a man grows older. This is the main cause of this condition. Use of medicines, such as antidepressants, steroids, and opioids. Diseases and conditions that affect the testicles or the making of testosterone . These include: Injury or damage to the testicles from trauma, cancer, cancer treatment, or infection. Diabetes. Sleep apnea. Genetic conditions that men are born with. Disease of the pituitary gland. This gland is in the brain. It produces hormones. Obesity. Metabolic syndrome. This is a group of diseases that affect blood pressure, blood sugar, cholesterol, and belly fat. HIV or AIDS. Alcohol abuse. Kidney failure. Other long-term or chronic diseases. What are the signs or symptoms? Common symptoms of this condition include: Loss of interest in sex (low sex drive). Inability to have or maintain an erection (erectile dysfunction). Feeling tired  (fatigue). Mood changes, like irritability or depression. Loss of muscle and body hair. Infertility. Large breasts. Weight gain (obesity). How is this diagnosed? Your health care provider can diagnose hypogonadism based on: Your signs and symptoms. A physical exam to check your testosterone  levels. This includes blood tests. Testosterone  levels can change throughout the day. Levels are highest in the morning. You may need to have repeat blood tests before getting a diagnosis of hypogonadism. Depending on your medical history and test results, your health care provider may also do other tests to find the cause of low testosterone . How is this treated? This condition is treated with testosterone  replacement therapy. Testosterone  can be given by: Injection or through pellets inserted under the skin. Gels or patches placed on the skin or in the mouth. Testosterone  therapy is not for everyone. It has risks and side effects. Your health care provider will consider your medical history, your risk for prostate cancer, your age, and your symptoms before putting you on testosterone  replacement therapy. Follow these instructions at home: Take over-the-counter and prescription medicines only as told by your health care provider. Eat foods that are high in fiber, such as beans, whole grains, and fresh fruits and vegetables. Limit foods that are high in fat and processed sugars, such as fried or sweet foods. If you drink alcohol: Limit how much you have to 0-2 drinks a day. Know how much alcohol is in your drink. In the [place] one drink equals one 12 oz bottle of beer (355 mL), one 5 oz glass of wine (148 mL), or one 1 oz glass of hard liquor (44 mL). Return to your normal activities as told by your health care provider. Ask your health care provider what activities are safe for you. Keep all  follow-up visits. This is important. Contact a health care provider if: You have any of the signs or symptoms of  low testosterone . You have any side effects from testosterone  therapy. Summary Male hypogonadism is a condition of having a level of testosterone  that is lower than normal. The natural drop in testosterone  production that occurs with age is the most common cause of this condition. Low testosterone  can also be caused by many diseases and conditions that affect the testicles and the making of testosterone . This condition is treated with testosterone  replacement therapy. There are risks and side effects of testosterone  therapy. Your health care provider will consider your age, medical history, symptoms, and risks for prostate cancer before putting you on testosterone  therapy. This information is not intended to replace advice given to you by your health care provider. Make sure you discuss any questions you have with your health care provider. Document Revised: 10/13/2023 Document Reviewed: 10/13/2023 [employer] Patient Education  2025 [employer].

## 2024-11-30 ENCOUNTER — Other Ambulatory Visit

## 2024-11-30 DIAGNOSIS — E291 Testicular hypofunction: Secondary | ICD-10-CM

## 2024-12-01 LAB — CBC
Hematocrit: 52.2 % — ABNORMAL HIGH (ref 37.5–51.0)
Hemoglobin: 17.7 g/dL (ref 13.0–17.7)
MCH: 31.7 pg (ref 26.6–33.0)
MCHC: 33.9 g/dL (ref 31.5–35.7)
MCV: 94 fL (ref 79–97)
Platelets: 268 x10E3/uL (ref 150–450)
RBC: 5.58 x10E6/uL (ref 4.14–5.80)
RDW: 12.3 % (ref 11.6–15.4)
WBC: 9.3 x10E3/uL (ref 3.4–10.8)

## 2025-04-26 ENCOUNTER — Other Ambulatory Visit

## 2025-05-13 ENCOUNTER — Ambulatory Visit: Admitting: Urology
